# Patient Record
Sex: Male | Born: 1965 | Race: White | Hispanic: No | Marital: Single | Smoking: Never smoker
Health system: Southern US, Community
[De-identification: ages and names within clinical notes are randomized; demographics above are authoritative.]

## PROBLEM LIST (undated history)

## (undated) DIAGNOSIS — L309 Dermatitis, unspecified: Secondary | ICD-10-CM

## (undated) DIAGNOSIS — I10 Essential (primary) hypertension: Secondary | ICD-10-CM

## (undated) DIAGNOSIS — G4700 Insomnia, unspecified: Secondary | ICD-10-CM

## (undated) DIAGNOSIS — F329 Major depressive disorder, single episode, unspecified: Secondary | ICD-10-CM

## (undated) DIAGNOSIS — F32A Depression, unspecified: Secondary | ICD-10-CM

## (undated) DIAGNOSIS — K219 Gastro-esophageal reflux disease without esophagitis: Secondary | ICD-10-CM

## (undated) DIAGNOSIS — F419 Anxiety disorder, unspecified: Secondary | ICD-10-CM

## (undated) DIAGNOSIS — E785 Hyperlipidemia, unspecified: Secondary | ICD-10-CM

## (undated) DIAGNOSIS — T7840XA Allergy, unspecified, initial encounter: Secondary | ICD-10-CM

## (undated) HISTORY — DX: Insomnia, unspecified: G47.00

## (undated) HISTORY — DX: Allergy, unspecified, initial encounter: T78.40XA

## (undated) HISTORY — DX: Essential (primary) hypertension: I10

## (undated) HISTORY — DX: Gastro-esophageal reflux disease without esophagitis: K21.9

## (undated) HISTORY — DX: Major depressive disorder, single episode, unspecified: F32.9

## (undated) HISTORY — DX: Depression, unspecified: F32.A

## (undated) HISTORY — DX: Anxiety disorder, unspecified: F41.9

## (undated) HISTORY — DX: Hyperlipidemia, unspecified: E78.5

## (undated) HISTORY — DX: Dermatitis, unspecified: L30.9

## (undated) HISTORY — PX: HERNIA REPAIR: SHX51

---

## 2007-03-12 ENCOUNTER — Emergency Department (HOSPITAL_COMMUNITY): Admission: EM | Admit: 2007-03-12 | Discharge: 2007-03-12 | Payer: Self-pay | Admitting: Emergency Medicine

## 2007-03-22 ENCOUNTER — Emergency Department (HOSPITAL_COMMUNITY): Admission: EM | Admit: 2007-03-22 | Discharge: 2007-03-22 | Payer: Self-pay | Admitting: Family Medicine

## 2007-03-27 ENCOUNTER — Ambulatory Visit (HOSPITAL_COMMUNITY): Admission: RE | Admit: 2007-03-27 | Discharge: 2007-03-27 | Payer: Self-pay | Admitting: Orthopedic Surgery

## 2007-05-19 ENCOUNTER — Emergency Department (HOSPITAL_COMMUNITY): Admission: EM | Admit: 2007-05-19 | Discharge: 2007-05-19 | Payer: Self-pay | Admitting: Emergency Medicine

## 2007-06-06 ENCOUNTER — Emergency Department (HOSPITAL_COMMUNITY): Admission: EM | Admit: 2007-06-06 | Discharge: 2007-06-06 | Payer: Self-pay | Admitting: Family Medicine

## 2007-08-06 ENCOUNTER — Emergency Department (HOSPITAL_COMMUNITY): Admission: EM | Admit: 2007-08-06 | Discharge: 2007-08-06 | Payer: Self-pay | Admitting: Emergency Medicine

## 2007-08-20 ENCOUNTER — Emergency Department (HOSPITAL_COMMUNITY): Admission: EM | Admit: 2007-08-20 | Discharge: 2007-08-20 | Payer: Self-pay | Admitting: Emergency Medicine

## 2007-12-21 ENCOUNTER — Emergency Department (HOSPITAL_COMMUNITY): Admission: EM | Admit: 2007-12-21 | Discharge: 2007-12-21 | Payer: Self-pay | Admitting: Emergency Medicine

## 2008-01-12 ENCOUNTER — Emergency Department (HOSPITAL_COMMUNITY): Admission: EM | Admit: 2008-01-12 | Discharge: 2008-01-12 | Payer: Self-pay | Admitting: Emergency Medicine

## 2008-01-15 ENCOUNTER — Emergency Department (HOSPITAL_COMMUNITY): Admission: EM | Admit: 2008-01-15 | Discharge: 2008-01-15 | Payer: Self-pay | Admitting: Emergency Medicine

## 2008-02-16 ENCOUNTER — Emergency Department (HOSPITAL_COMMUNITY): Admission: EM | Admit: 2008-02-16 | Discharge: 2008-02-16 | Payer: Self-pay | Admitting: Family Medicine

## 2008-05-10 ENCOUNTER — Emergency Department (HOSPITAL_COMMUNITY): Admission: EM | Admit: 2008-05-10 | Discharge: 2008-05-10 | Payer: Self-pay | Admitting: Emergency Medicine

## 2008-05-12 ENCOUNTER — Emergency Department (HOSPITAL_COMMUNITY): Admission: EM | Admit: 2008-05-12 | Discharge: 2008-05-12 | Payer: Self-pay | Admitting: Emergency Medicine

## 2008-06-08 ENCOUNTER — Emergency Department (HOSPITAL_COMMUNITY): Admission: EM | Admit: 2008-06-08 | Discharge: 2008-06-08 | Payer: Self-pay | Admitting: Family Medicine

## 2008-09-23 ENCOUNTER — Emergency Department (HOSPITAL_COMMUNITY): Admission: EM | Admit: 2008-09-23 | Discharge: 2008-09-23 | Payer: Self-pay | Admitting: Emergency Medicine
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
## 2008-10-17 ENCOUNTER — Encounter: Admission: RE | Admit: 2008-10-17 | Discharge: 2008-10-17 | Payer: Self-pay | Admitting: Orthopaedic Surgery

## 2008-10-22 ENCOUNTER — Encounter: Admission: RE | Admit: 2008-10-22 | Discharge: 2008-10-22 | Payer: Self-pay | Admitting: Orthopaedic Surgery

## 2008-11-13 ENCOUNTER — Emergency Department (HOSPITAL_COMMUNITY): Admission: EM | Admit: 2008-11-13 | Discharge: 2008-11-14 | Payer: Self-pay | Admitting: Emergency Medicine

## 2008-11-15 ENCOUNTER — Emergency Department (HOSPITAL_COMMUNITY): Admission: EM | Admit: 2008-11-15 | Discharge: 2008-11-15 | Payer: Self-pay | Admitting: Emergency Medicine

## 2008-12-21 ENCOUNTER — Emergency Department (HOSPITAL_COMMUNITY): Admission: EM | Admit: 2008-12-21 | Discharge: 2008-12-21 | Payer: Self-pay | Admitting: Emergency Medicine

## 2009-03-24 ENCOUNTER — Emergency Department (HOSPITAL_COMMUNITY): Admission: EM | Admit: 2009-03-24 | Discharge: 2009-03-24 | Payer: Self-pay | Admitting: Emergency Medicine

## 2009-04-05 ENCOUNTER — Emergency Department (HOSPITAL_COMMUNITY): Admission: EM | Admit: 2009-04-05 | Discharge: 2009-04-05 | Payer: Self-pay | Admitting: Emergency Medicine

## 2009-04-14 ENCOUNTER — Encounter (INDEPENDENT_AMBULATORY_CARE_PROVIDER_SITE_OTHER): Payer: Self-pay | Admitting: Orthopedic Surgery

## 2009-04-14 ENCOUNTER — Ambulatory Visit: Payer: Self-pay | Admitting: Vascular Surgery

## 2009-04-14 ENCOUNTER — Ambulatory Visit: Admission: RE | Admit: 2009-04-14 | Discharge: 2009-04-14 | Payer: Self-pay | Admitting: Orthopedic Surgery

## 2009-05-28 ENCOUNTER — Emergency Department (HOSPITAL_COMMUNITY): Admission: EM | Admit: 2009-05-28 | Discharge: 2009-05-28 | Payer: Self-pay | Admitting: Emergency Medicine

## 2009-06-05 ENCOUNTER — Emergency Department (HOSPITAL_COMMUNITY): Admission: EM | Admit: 2009-06-05 | Discharge: 2009-06-05 | Payer: Self-pay | Admitting: Emergency Medicine

## 2009-06-08 ENCOUNTER — Emergency Department (HOSPITAL_COMMUNITY): Admission: EM | Admit: 2009-06-08 | Discharge: 2009-06-08 | Payer: Self-pay | Admitting: Chiropractic Medicine

## 2009-06-10 ENCOUNTER — Emergency Department (HOSPITAL_COMMUNITY): Admission: EM | Admit: 2009-06-10 | Discharge: 2009-06-10 | Payer: Self-pay | Admitting: Family Medicine

## 2009-07-18 ENCOUNTER — Encounter: Payer: Self-pay | Admitting: Family Medicine

## 2009-07-27 ENCOUNTER — Ambulatory Visit: Payer: Self-pay | Admitting: Family Medicine

## 2009-07-27 DIAGNOSIS — F341 Dysthymic disorder: Secondary | ICD-10-CM

## 2009-07-27 DIAGNOSIS — K219 Gastro-esophageal reflux disease without esophagitis: Secondary | ICD-10-CM

## 2009-08-01 ENCOUNTER — Telehealth: Payer: Self-pay | Admitting: Family Medicine

## 2009-08-02 ENCOUNTER — Telehealth: Payer: Self-pay | Admitting: Family Medicine

## 2009-08-02 ENCOUNTER — Ambulatory Visit: Payer: Self-pay | Admitting: Family Medicine

## 2009-08-03 ENCOUNTER — Telehealth: Payer: Self-pay | Admitting: Family Medicine

## 2009-08-04 ENCOUNTER — Telehealth (INDEPENDENT_AMBULATORY_CARE_PROVIDER_SITE_OTHER): Payer: Self-pay | Admitting: *Deleted

## 2009-08-08 ENCOUNTER — Telehealth: Payer: Self-pay | Admitting: Family Medicine

## 2009-08-09 ENCOUNTER — Encounter: Payer: Self-pay | Admitting: Family Medicine

## 2009-08-10 LAB — CONVERTED CEMR LAB
AST: 30 units/L (ref 0–37)
Albumin: 4.4 g/dL (ref 3.5–5.2)
BUN: 15 mg/dL (ref 6–23)
Calcium: 9.3 mg/dL (ref 8.4–10.5)
Chloride: 103 meq/L (ref 96–112)
HDL: 46 mg/dL (ref >39)
Potassium: 3.7 meq/L (ref 3.5–5.3)

## 2009-08-15 ENCOUNTER — Telehealth: Payer: Self-pay | Admitting: Family Medicine

## 2009-08-15 ENCOUNTER — Emergency Department (HOSPITAL_COMMUNITY): Admission: EM | Admit: 2009-08-15 | Discharge: 2009-08-16 | Payer: Self-pay | Admitting: Emergency Medicine

## 2009-08-23 ENCOUNTER — Ambulatory Visit: Payer: Self-pay | Admitting: Family Medicine

## 2009-08-23 DIAGNOSIS — R7309 Other abnormal glucose: Secondary | ICD-10-CM

## 2009-08-23 DIAGNOSIS — E785 Hyperlipidemia, unspecified: Secondary | ICD-10-CM | POA: Insufficient documentation

## 2009-08-24 ENCOUNTER — Telehealth: Payer: Self-pay | Admitting: Family Medicine

## 2009-08-26 ENCOUNTER — Telehealth: Payer: Self-pay | Admitting: Family Medicine

## 2009-08-29 ENCOUNTER — Ambulatory Visit: Payer: Self-pay | Admitting: Family Medicine

## 2009-08-29 ENCOUNTER — Telehealth: Payer: Self-pay | Admitting: Family Medicine

## 2009-08-29 ENCOUNTER — Encounter: Payer: Self-pay | Admitting: Family Medicine

## 2009-08-30 ENCOUNTER — Telehealth: Payer: Self-pay | Admitting: Family Medicine

## 2009-08-31 ENCOUNTER — Telehealth: Payer: Self-pay | Admitting: Family Medicine

## 2009-08-31 ENCOUNTER — Telehealth (INDEPENDENT_AMBULATORY_CARE_PROVIDER_SITE_OTHER): Payer: Self-pay | Admitting: *Deleted

## 2009-09-01 ENCOUNTER — Telehealth (INDEPENDENT_AMBULATORY_CARE_PROVIDER_SITE_OTHER): Payer: Self-pay | Admitting: *Deleted

## 2009-09-02 ENCOUNTER — Encounter (INDEPENDENT_AMBULATORY_CARE_PROVIDER_SITE_OTHER): Payer: Self-pay | Admitting: *Deleted

## 2009-09-02 ENCOUNTER — Encounter: Admission: RE | Admit: 2009-09-02 | Discharge: 2009-09-02 | Payer: Self-pay | Admitting: Family Medicine

## 2009-09-02 ENCOUNTER — Ambulatory Visit: Payer: Self-pay | Admitting: Family Medicine

## 2009-09-02 ENCOUNTER — Encounter: Payer: Self-pay | Admitting: Family Medicine

## 2009-09-05 ENCOUNTER — Ambulatory Visit: Payer: Self-pay | Admitting: Family Medicine

## 2009-09-07 ENCOUNTER — Telehealth: Payer: Self-pay | Admitting: Family Medicine

## 2009-09-09 ENCOUNTER — Telehealth: Payer: Self-pay | Admitting: Family Medicine

## 2009-09-12 ENCOUNTER — Encounter: Payer: Self-pay | Admitting: Family Medicine

## 2009-09-12 ENCOUNTER — Ambulatory Visit: Payer: Self-pay | Admitting: Family Medicine

## 2009-09-12 DIAGNOSIS — I10 Essential (primary) hypertension: Secondary | ICD-10-CM | POA: Insufficient documentation

## 2009-09-22 ENCOUNTER — Ambulatory Visit (HOSPITAL_BASED_OUTPATIENT_CLINIC_OR_DEPARTMENT_OTHER): Admission: RE | Admit: 2009-09-22 | Discharge: 2009-09-22 | Payer: Self-pay | Admitting: General Surgery

## 2009-09-23 ENCOUNTER — Telehealth: Payer: Self-pay | Admitting: Family Medicine

## 2009-09-28 ENCOUNTER — Encounter: Payer: Self-pay | Admitting: Family Medicine

## 2009-09-28 ENCOUNTER — Ambulatory Visit: Payer: Self-pay | Admitting: Family Medicine

## 2009-09-28 ENCOUNTER — Telehealth: Payer: Self-pay | Admitting: Family Medicine

## 2009-09-28 LAB — CONVERTED CEMR LAB
Amphetamine Screen, Ur: NEGATIVE
Benzodiazepines.: NEGATIVE
Marijuana Metabolite: NEGATIVE
Methadone: NEGATIVE
Phencyclidine (PCP): NEGATIVE

## 2009-09-29 ENCOUNTER — Telehealth: Payer: Self-pay | Admitting: Family Medicine

## 2009-09-29 ENCOUNTER — Encounter: Payer: Self-pay | Admitting: Family Medicine

## 2009-10-01 ENCOUNTER — Telehealth: Payer: Self-pay | Admitting: Family Medicine

## 2009-10-01 ENCOUNTER — Emergency Department (HOSPITAL_COMMUNITY): Admission: EM | Admit: 2009-10-01 | Discharge: 2009-10-01 | Payer: Self-pay | Admitting: Emergency Medicine

## 2009-10-03 ENCOUNTER — Telehealth: Payer: Self-pay | Admitting: Family Medicine

## 2009-10-03 ENCOUNTER — Ambulatory Visit: Payer: Self-pay | Admitting: Family Medicine

## 2009-10-04 ENCOUNTER — Telehealth: Payer: Self-pay | Admitting: Family Medicine

## 2009-10-06 ENCOUNTER — Telehealth: Payer: Self-pay | Admitting: Family Medicine

## 2009-10-07 ENCOUNTER — Emergency Department (HOSPITAL_COMMUNITY): Admission: EM | Admit: 2009-10-07 | Discharge: 2009-10-07 | Payer: Self-pay | Admitting: Emergency Medicine

## 2009-10-12 ENCOUNTER — Ambulatory Visit: Payer: Self-pay | Admitting: Family Medicine

## 2009-10-12 ENCOUNTER — Encounter: Payer: Self-pay | Admitting: Family Medicine

## 2009-10-13 ENCOUNTER — Telehealth: Payer: Self-pay | Admitting: Family Medicine

## 2009-10-19 ENCOUNTER — Ambulatory Visit: Payer: Self-pay | Admitting: Family Medicine

## 2009-10-25 ENCOUNTER — Emergency Department (HOSPITAL_COMMUNITY): Admission: EM | Admit: 2009-10-25 | Discharge: 2009-10-26 | Payer: Self-pay | Admitting: Emergency Medicine

## 2009-10-25 ENCOUNTER — Ambulatory Visit: Payer: Self-pay | Admitting: Family Medicine

## 2009-11-01 ENCOUNTER — Telehealth: Payer: Self-pay | Admitting: Family Medicine

## 2009-11-03 ENCOUNTER — Telehealth: Payer: Self-pay | Admitting: Family Medicine

## 2009-11-06 ENCOUNTER — Telehealth: Payer: Self-pay | Admitting: Family Medicine

## 2009-11-07 ENCOUNTER — Emergency Department (HOSPITAL_COMMUNITY): Admission: EM | Admit: 2009-11-07 | Discharge: 2009-11-08 | Payer: Self-pay | Admitting: Emergency Medicine

## 2009-11-07 ENCOUNTER — Telehealth: Payer: Self-pay | Admitting: Family Medicine

## 2009-11-07 ENCOUNTER — Encounter: Payer: Self-pay | Admitting: Family Medicine

## 2009-11-08 ENCOUNTER — Telehealth: Payer: Self-pay | Admitting: Family Medicine

## 2009-11-08 ENCOUNTER — Ambulatory Visit: Payer: Self-pay | Admitting: Family Medicine

## 2009-11-11 ENCOUNTER — Ambulatory Visit: Payer: Self-pay | Admitting: Family Medicine

## 2009-11-16 ENCOUNTER — Ambulatory Visit: Payer: Self-pay | Admitting: Family Medicine

## 2009-11-22 ENCOUNTER — Encounter: Payer: Self-pay | Admitting: Family Medicine

## 2009-11-29 ENCOUNTER — Telehealth: Payer: Self-pay | Admitting: Family Medicine

## 2009-12-05 ENCOUNTER — Ambulatory Visit: Payer: Self-pay | Admitting: Family Medicine

## 2009-12-22 ENCOUNTER — Ambulatory Visit: Payer: Self-pay | Admitting: Family Medicine

## 2009-12-28 ENCOUNTER — Telehealth: Payer: Self-pay | Admitting: Family Medicine

## 2009-12-28 ENCOUNTER — Emergency Department (HOSPITAL_COMMUNITY): Admission: EM | Admit: 2009-12-28 | Discharge: 2009-12-28 | Payer: Self-pay | Admitting: Emergency Medicine

## 2009-12-29 ENCOUNTER — Emergency Department (HOSPITAL_COMMUNITY): Admission: EM | Admit: 2009-12-29 | Discharge: 2009-12-29 | Payer: Self-pay | Admitting: Emergency Medicine

## 2009-12-29 ENCOUNTER — Ambulatory Visit: Payer: Self-pay | Admitting: Family Medicine

## 2009-12-31 ENCOUNTER — Emergency Department (HOSPITAL_COMMUNITY): Admission: EM | Admit: 2009-12-31 | Discharge: 2009-12-31 | Payer: Self-pay | Admitting: Family Medicine

## 2010-01-03 ENCOUNTER — Telehealth: Payer: Self-pay | Admitting: Family Medicine

## 2010-01-03 ENCOUNTER — Encounter: Payer: Self-pay | Admitting: Family Medicine

## 2010-01-06 ENCOUNTER — Ambulatory Visit: Payer: Self-pay | Admitting: Family Medicine

## 2010-01-06 ENCOUNTER — Encounter: Payer: Self-pay | Admitting: Family Medicine

## 2010-01-15 ENCOUNTER — Emergency Department (HOSPITAL_COMMUNITY): Admission: EM | Admit: 2010-01-15 | Discharge: 2010-01-15 | Payer: Self-pay | Admitting: Emergency Medicine

## 2010-01-16 ENCOUNTER — Telehealth: Payer: Self-pay | Admitting: Family Medicine

## 2010-01-18 ENCOUNTER — Telehealth: Payer: Self-pay | Admitting: Family Medicine

## 2010-01-19 ENCOUNTER — Encounter: Payer: Self-pay | Admitting: Family Medicine

## 2010-01-20 ENCOUNTER — Emergency Department (HOSPITAL_COMMUNITY): Admission: EM | Admit: 2010-01-20 | Discharge: 2010-01-20 | Payer: Self-pay | Admitting: Emergency Medicine

## 2010-02-07 ENCOUNTER — Telehealth: Payer: Self-pay | Admitting: Family Medicine

## 2010-02-07 ENCOUNTER — Ambulatory Visit: Payer: Self-pay | Admitting: Family Medicine

## 2010-02-27 ENCOUNTER — Ambulatory Visit: Payer: Self-pay | Admitting: Family Medicine

## 2010-02-28 ENCOUNTER — Encounter: Payer: Self-pay | Admitting: Family Medicine

## 2010-03-03 ENCOUNTER — Encounter: Payer: Self-pay | Admitting: Family Medicine

## 2010-03-09 ENCOUNTER — Encounter: Payer: Self-pay | Admitting: Family Medicine

## 2010-05-16 ENCOUNTER — Encounter: Payer: Self-pay | Admitting: Family Medicine

## 2010-05-25 ENCOUNTER — Telehealth: Payer: Self-pay | Admitting: Family Medicine
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
## 2010-06-06 ENCOUNTER — Ambulatory Visit: Payer: Self-pay | Admitting: Family Medicine

## 2010-06-06 ENCOUNTER — Encounter: Payer: Self-pay | Admitting: Family Medicine

## 2010-06-06 ENCOUNTER — Telehealth (INDEPENDENT_AMBULATORY_CARE_PROVIDER_SITE_OTHER): Payer: Self-pay | Admitting: Family Medicine

## 2010-06-06 DIAGNOSIS — M5380 Other specified dorsopathies, site unspecified: Secondary | ICD-10-CM | POA: Insufficient documentation

## 2010-06-07 ENCOUNTER — Telehealth: Payer: Self-pay | Admitting: Family Medicine

## 2010-06-07 ENCOUNTER — Encounter: Payer: Self-pay | Admitting: *Deleted

## 2010-06-07 LAB — CONVERTED CEMR LAB
CO2: 25 meq/L (ref 19–32)
Chloride: 105 meq/L (ref 96–112)
Glucose, Bld: 161 mg/dL — ABNORMAL HIGH (ref 70–99)
Potassium: 4.6 meq/L (ref 3.5–5.3)
Sodium: 140 meq/L (ref 135–145)

## 2010-07-10 ENCOUNTER — Telehealth: Payer: Self-pay | Admitting: Family Medicine

## 2010-07-13 ENCOUNTER — Ambulatory Visit: Payer: Self-pay | Admitting: Family Medicine

## 2010-07-13 DIAGNOSIS — G4700 Insomnia, unspecified: Secondary | ICD-10-CM

## 2010-07-18 ENCOUNTER — Emergency Department (HOSPITAL_COMMUNITY)
Admission: EM | Admit: 2010-07-18 | Discharge: 2010-07-19 | Payer: Self-pay | Source: Home / Self Care | Admitting: Emergency Medicine

## 2010-07-21 ENCOUNTER — Ambulatory Visit: Payer: Self-pay | Admitting: Family Medicine

## 2010-08-09 ENCOUNTER — Telehealth: Payer: Self-pay | Admitting: Family Medicine

## 2010-08-27 ENCOUNTER — Encounter: Payer: Self-pay | Admitting: Orthopaedic Surgery

## 2010-08-28 ENCOUNTER — Encounter: Payer: Self-pay | Admitting: Orthopedic Surgery

## 2010-09-05 NOTE — Progress Notes (Signed)
  Phone Note Call from Patient   Caller: Patient Summary of Call: Pt called due to tooth pain.  Pt has a appointment on tuesday for one tooth to be pulled and one needed a root canal.  Pt states he is having significant pain in the lower jaw around the tooth.  Pt was on percocet earlier on and was helping but no on vicodin and is not helping at all.  Pt states the pain is as bad as it first was initially.  Pt denies fever, chills, nausea, vomiting, diarrhea or constipation .  Pt told that we could not fill rx over the phone for narcotics.  To continue NSAIDs to help decrease inflammation.  If pain is unbearable or he gets a fever, he needs to be seen by urgent care or ED, otherwise attempt to wait till monday and we can get him a workin appointment.

## 2010-09-05 NOTE — Assessment & Plan Note (Signed)
Summary: tooth pain-see notes/Eros/Timothy Lowe   Vital Signs:  Patient profile:   45 year old male Weight:      197.7 pounds Temp:     98 degrees F oral Pulse rate:   86 / minute BP sitting:   134 / 92  (left arm)  Vitals Entered By: Renato Battles slade,cma CC: see note. pt has been at the ER on Saturday. received 2 Percocet Is Patient Diabetic? No Pain Assessment Patient in pain? yes     Location: bottom left tooth Intensity: 9 Onset of pain  saturday   Primary Care Provider:  Clementeen Graham  CC:  see note. pt has been at the ER on Saturday. received 2 Percocet.  History of Present Illness: Here with severe tooth pain, was given script for hydrocodone per primary MD and pain contract was made.  He has visited the dentist through the indigent program and he has 2 appointments one on 3/9, and one on 3/15.  He has so much pain this weekend that he visited the ER, and was given 2 Percocet that helped his pain.  He says that the hydrocodone/APAP 2 tabs with the Voltarin does not touch the pain. He has been using orajel, and tried dental wax that fall out.  He is interviewing for a job, is not sleeping and is asking for a script for Percocet until his teeth are repaired.  His primary MD is not in clinic, and he was brought in as a work in.  It was agreed for only one scipt until the teeth are fixed, that he would be provided with #30 Percocet 5/325.  He will switch to ibuprofen 800 mg three times a day since the diclofenec is not effective.  He has a follow up apt with Dr. Denyse Amass on 3/15.  Habits & Providers  Alcohol-Tobacco-Diet     Tobacco Status: never  Allergies: No Known Drug Allergies  Physical Exam  General:  Alert, well groomed, appropriate Mouth:  2 broken teeth, bottom first molar with crack in tooth and very sensitive to touch.   Impression & Recommendations:  Problem # 1:  FRACTURED TOOTH (VWU-981.19)  Unfortunately patient was brought in for a legitimate problem requiring a  change in pain med that was not in the written pain contract and MD was not in clinic.  Felt that his pain was justified and he agreed to not take the hydrocodone that he had remaining, he will and add ibuprofen 800 mg three times a day on a regular schedule, may use Percocet at night to get sleep, not more than 2 per day.  Patient seems to be forthright and dependable.  I explained to him that this was not consistent with the pain contract he has, and it could cause problems if this pattern would continue.  Given script for Percocet 5/325 #30, no refills.  This is to last through dental appointments on 9/9 and 9/15.  Orders: [facility]- Est Level  3 (14782)  Complete Medication List: 1)  Lexapro 20 Mg Tabs (Escitalopram oxalate) .Marland Kitchen.. 1 by mouth daily 2)  Wellbutrin Xl 150 Mg Xr24h-tab (Bupropion hcl) .Marland Kitchen.. 1 by mouth daily 3)  Ambien 10 Mg Tabs (Zolpidem tartrate) .Marland Kitchen.. 1 by mouth qhs 4)  Penicillin V Potassium 500 Mg Tabs (Penicillin v potassium) .... Take 1 tab by mouth three times a day 5)  Pravastatin Sodium 40 Mg Tabs (Pravastatin sodium) .... Take 1 pill by mouth daily 6)  Ventolin Hfa 108 (90 Base) Mcg/act Aers (Albuterol sulfate) .Marland KitchenMarland KitchenMarland Kitchen  2 puffs every 4 hours as needed for sob; disp 1 inhaler 7)  Prilosec 20 Mg Cpdr (Omeprazole) .Marland Kitchen.. 1 by mouth daily 8)  Hydrochlorothiazide 25 Mg Tabs (Hydrochlorothiazide) .... Take 1 tab by mouth daily 9)  Diclofenac Sodium 75 Mg Tbec (Diclofenac sodium) .Marland Kitchen.. 1 by mouth bid 10)  Percocet 5-325 Mg Tabs (Oxycodone-acetaminophen) .... One tab two times a day until dental visit  Patient Instructions: 1)  Ibuprofen 800 mg three times a day all the time( 4 ot the 200 mg) 2)  Do not take more than 2400 mg of ibuprofen 3)   in 24 hours. 4)  Tylenol doseing is not more than 4000 mg in 24 hours. 5)  Percocet is for 2 weeks, until toot is repaired. Prescriptions: PERCOCET 5-325 MG TABS (OXYCODONE-ACETAMINOPHEN) one tab two times a day until Dental visit Brand medically  necessary #30 x 0   Entered and Authorized by:   Luretha Murphy NP   Signed by:   Luretha Murphy NP on 10/03/2009   Method used:   Print then Give to Patient   RxID:   [id number]

## 2010-09-05 NOTE — Progress Notes (Signed)
Summary: phn msg  Phone Note Call from Patient Call back at Home Phone [phone]   Caller: Patient Summary of Call: wants to know about the referral for his hernia Initial call taken by: De Nurse,  August 31, 2009 10:17 AM  Follow-up for Phone Call        spoke with patient and advised that referral has been sent to [facility] and we are waiting to hear from them if they have a surgeon available to schedule appointment with. patient understands process. Follow-up by: Theresia Lo RN,  August 31, 2009 10:24 AM
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
## 2010-09-05 NOTE — Progress Notes (Signed)
Summary: Tooth pain   Phone Note Call from Patient   Summary of Call: Pain management for dental work by Dr. Denyse Amass - on wait list to get root canal - broke tooth that needed root canal two days ago. Calling because he is almost out of pain medication; wants a refill as his tooth is hurting.   Denies fever, difficulty swallowing or breathing, emesis, lethargy. Advised to call triage line in AM to get an appointment to be seen, or to call tro try to see if Dr. Denyse Amass would be willing to refill his pain medication.  Initial call taken by: Bobby Rumpf  MD,  November 06, 2009 1:18 PM     Appended Document: Tooth pain  lm with a woman who answered. asked that she have him call his doctor's office back

## 2010-09-05 NOTE — Progress Notes (Signed)
Summary: pls call  Phone Note Call from Patient Call back at Home Phone [phone]   Caller: Patient Summary of Call: needs for Dr Denyse Amass to call and ask question - has an interview tomorrow at 2:45 and will be leaving at 2:30 -  Initial call taken by: De Nurse,  September 07, 2009 2:26 PM  Follow-up for Phone Call        Called.  He will schedule an appointment with me next week.

## 2010-09-05 NOTE — Progress Notes (Signed)
Summary: Referral Req  Phone Note Call from Patient Call back at Home Phone [phone]   Caller: Patient Summary of Call: Pt is requesting a referral to an orthopedist about his ankles. Initial call taken by: Clydell Hakim,  January 18, 2010 3:07 PM  Follow-up for Phone Call        I have not seen Mr Q about this complaint. Therefore I do not know what his specific ankle complaints are.  If he would like to come for an office visit myself or another physician would be happy to evlauate him and referr if needed.  Follow-up by: Clementeen Graham MD,  January 19, 2010 1:55 PM

## 2010-09-05 NOTE — Progress Notes (Signed)
Summary: Rx Req  Phone Note Refill Request Call back at Home Phone [phone] Message from:  Patient  Refills Requested: Medication #1:  OXYCODONE-ACETAMINOPHEN 10-325 MG TABS 1 by mouth q4-6 hrs as needed severe pain.. Pt has appt with [facility] at end of month.    Initial call taken by: Clydell Hakim,  August 15, 2009 10:01 AM  Follow-up for Phone Call        will forward to MD. Follow-up by: Theresia Lo RN,  August 15, 2009 11:57 AM  Additional Follow-up for Phone Call Additional follow up Details #1::        Pt has a dental appt on Jan 26th.   Called patient.  He will pick up Rx at [facility] for 50 10/325mg  percocet tabs.  This should last two weeks. [facility] at front desk. Please date RX.  Will f/u with me Jan 18th.      Appended Document: Rx Req patient came in office and RX given to him.

## 2010-09-05 NOTE — Progress Notes (Signed)
Summary: triage  Phone Note Call from Patient Call back at [phone]   Caller: Patient Summary of Call: Pt wanting a referral to an orthopedist for his ankle and also wanting to get rx for anxiety due to family stressors. [facility] [address] Initial call taken by: De Nurse,  Jan 03, 2010 9:31 AM  Follow-up for Phone Call       Follow-up by: Golden Circle RN,  Jan 03, 2010 8:49 AM  Additional Follow-up for Phone Call Additional follow up Details #1::        Called patient and talked about ankle pain.  He "twisted" his ankle several times.  He also talked about anxiet medication.  He will make an appointment this Friday with me. OK to double book. Additional Follow-up by: Clementeen Graham MD,  Jan 03, 2010 11:02 AM
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
## 2010-09-05 NOTE — Miscellaneous (Signed)
Summary: [facility] Controlled Substances Contract  [facility] Controlled Substances Contract   Imported By: Clydell Hakim 09/15/2009 15:10:19  _____________________________________________________________________  External Attachment:    Type:   Image     Comment:   External Document
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
## 2010-09-05 NOTE — Progress Notes (Signed)
Summary: SEE DR.TA'S MESSAGE PLEASE/TS  Phone Note Refill Request Call back at Home Phone [phone] Call back at [phone]   Refills Requested: Medication #1:  AMBIEN 10 MG TABS 1 by mouth qhs Timothy Lowe need refill for his Ambien to [facility] on [address]  Initial call taken by: Abundio Miu,  June 06, 2010 10:33 AM  Follow-up for Phone Call        Mr. Timothy Lowe want to talk with you about the denial for refill on his Ambien.  He said that was why he came in to see you. Follow-up by: Abundio Miu,  June 06, 2010 12:26 PM  Additional Follow-up for Phone Call Additional follow up Details #1::        pt states that he thought she was refilling meds since she said she was going to call in the 2 that needed refilling (thinking that the Ambien was one of them)  He states the it is his fault that he did not specifically speak about the Ambien.  He got them in Jan with 8 refills and he has been trying since Sept to get this refilled and has tried to go through the steps that we require and still can't seem to get the refill.  not sure what he should do at this point. Additional Follow-up by: De Nurse,  June 06, 2010 1:48 PM    Additional Follow-up for Phone Call Additional follow up Details #2::    According to our chart Ambien was given one time in Aug 30, 2009 for #30 tabs, zero refills.  If he has been getting them since Jan with 8 refills, he has not gotten them from this office.  Again, we did not discuss ambien during office visit.  I will not refill this med. Follow-up by: Cat Ta MD,  June 06, 2010 3:30 PM  No.  I will not refill this.  He has not had this since Jan.  We did not discuss this in the OV.  Cat Ta MD  June 06, 2010 10:50 AM

## 2010-09-05 NOTE — Miscellaneous (Signed)
  Clinical Lists Changes  Mr Chock called requesting Rx for Ambien, says that was the only reason for his visit with Dr Janalyn Harder. After speaking with Dr Janalyn Harder I Explained to pt that he and Dr Janalyn Harder did NOT disscuss this during the visit and she would not prescribe the ambien at this time, there would need to be an office visit to disscuss this particular med before it would be prescribed to him. Billal continued to argue that was the only reason that he came in, was for the [place] and wanted to know why he couldn't have it. States that this was prescribed to him before by Dr Denyse Amass with 8 refills. After looking in the chart it seems that this Rx was prescribed once with no refills. I explained to him several times that Dr Janalyn Harder was now his MD and would NOT be prescribing the ambien at this time and Mr Barradas stated " what do I have to do to get my GD [place]." He also said that he cannot deal with Dr Janalyn Harder anymore and no longer wants her as his MD. Afton continued to argue with me and be rude, not understanding why he could not get the [place] and finally hung up the phone.Tessie Fass CMA  June 07, 2010 5:27 PM

## 2010-09-05 NOTE — Progress Notes (Signed)
Summary: Appt  Phone Note Call from Patient Call back at Home Phone [phone]   Reason for Call: Talk to Nurse Summary of Call: pt was told to f/u with [facility] on Friday but she is booked except for her [place] appts, please advise, pt already has appt scheduled with Denyse Amass on 4/13. Initial call taken by: Knox Royalty,  November 08, 2009 11:09 AM  Follow-up for Phone Call        to Dr. Denyse Amass Follow-up by: Golden Circle RN,  November 08, 2009 12:18 PM

## 2010-09-05 NOTE — Progress Notes (Signed)
Summary: Rx Prob  Phone Note Call from Patient Call back at Home Phone [phone]   Caller: Patient Summary of Call: Pt asking to speak with Dr. Denyse Amass about his pain med management for his teeth that he is having worked on.  Really would like to talk to him today said he left a message last week. Initial call taken by: Clydell Hakim,  November 01, 2009 11:39 AM  Follow-up for Phone Call        states he has waited more than a week & md has not called him back.  told him I do not see where a message was left for the md. he will try a few more weeks, then may choose to change mds. explained schedules of our residents.  he is using tylenol in between. went to ED & they gave him enough percocets for the next 24 hours.  having one pulled tomorrow. the root canal tooth will be in a few weeks.  his main issue with coming to [place] is pain management for the teeth problms he has been having. he uses the meds as ordered but when he has spikes of pain he uses more or goes to ED. not what he wants to do. told him I will page the md & call him with his response. Follow-up by: Golden Circle RN,  November 01, 2009 11:41 AM  Additional Follow-up for Phone Call Additional follow up Details #1::        md returned page. I asked him to call the pt. LM for pt that he should be getting a return call from pcp Additional Follow-up by: Golden Circle RN,  November 01, 2009 11:51 AM    Additional Follow-up for Phone Call Additional follow up Details #2::    Called back. Had to go to the ED due to uncontrolled mouth pain.  Vicodin only works for 1.5 hours.  He thinks the percocet that he was given in the ED works better. Has appt for tooth pull on 3/30 and is 3rd in line for a root cannal.  Should be done in 30 days.  Plan. Will leave Rx for 45 tabs at the nurses's desk.  He will pick it up tomorrow.  Follow-up by: Clementeen Graham MD,  November 01, 2009 12:17 PM

## 2010-09-05 NOTE — Progress Notes (Signed)
Summary: Ref Req  Phone Note Call from Patient Call back at Home Phone [phone]   Caller: Patient Summary of Call: Pt would like to be referred to someone about his hernia. Initial call taken by: Clydell Hakim,  August 26, 2009 4:05 PM  Follow-up for Phone Call        will forward to MD. Follow-up by: Theresia Lo RN,  August 26, 2009 4:08 PM  Additional Follow-up for Phone Call Additional follow up Details #1::        Order in and letter sent.   Will f/u with patient at next visit.
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
## 2010-09-05 NOTE — Progress Notes (Signed)
Summary: pls call  Phone Note Call from Patient Call back at Home Phone [phone]   Caller: Patient Summary of Call: needs to talk to Dr Denyse Amass about meds Initial call taken by: De Nurse,  September 29, 2009 8:38 AM  Follow-up for Phone Call        Called patient.  He had questions about his pain medication.  We will not provide more narcotics until March 15th at the earliest.  He understands this.

## 2010-09-05 NOTE — Progress Notes (Signed)
Summary: meds prob  Phone Note Call from Patient Call back at Home Phone [phone]   Caller: Patient Summary of Call: has cough meds and has run out - has been taking it every 12 hrs and ran out early - pharm told him that we just needed to call to authorize it being refilled early. [facility] [address] Initial call taken by: De Nurse,  September 09, 2009 10:21 AM  Follow-up for Phone Call        to preceptor since pcp is not here. see previous notes about his meds Follow-up by: Golden Circle RN,  September 09, 2009 10:57 AM  Additional Follow-up for Phone Call Additional follow up Details #1::        Refill denied.  Suggest OTC robitussin DM Additional Follow-up by: Doralee Albino MD,  September 09, 2009 11:04 AM    Additional Follow-up for Phone Call Additional follow up Details #2::    lm Follow-up by: Golden Circle RN,  September 09, 2009 1:52 PM  Additional Follow-up for Phone Call Additional follow up Details #3:: Details for Additional Follow-up Action Taken: he states he talked with Dr. Georgiana Shore & she told him her could take it every 12 hours. I told him her Ov notes indicate HS only. told him no early refill. states he will be better by then. states he feels much better already. told him to use the recommended OTC if he feels he needs it. states he has appt with Dr. Denyse Amass on Monday  Additional Follow-up by: Golden Circle RN,  September 09, 2009 2:10 PM

## 2010-09-05 NOTE — Progress Notes (Signed)
Summary: phn msg  Phone Note Call from Patient Call back at Home Phone [phone]   Caller: Patient Summary of Call: pt needs to talk to Topanga Alvelo about meds  went to ED Sat about his tooth -  Initial call taken by: De Nurse,  October 03, 2009 9:04 AM  Follow-up for Phone Call        ate fish & a bone got into the tooth that he is waiting for a root canal. pain was so bad, he went to ED. They gace him 2 percocets 5mg  when in ED.  He was worried about breaking the pain contract. told him it was fine as he reported it asap. current meds are not enough.  has appt 9th with dentist. does not feel he can wait. he reported this to the dental assistant & she just told him "we will see you on the 9th" workin at 11am today  Follow-up by: Golden Circle RN,  October 03, 2009 9:21 AM
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
## 2010-09-05 NOTE — Miscellaneous (Signed)
Summary: almost out of meds  Clinical Lists Changes states she will be out of meds by the end of the week. he has a job interview in [employer] next week & does not want to go there with intense tooth pain. would prefer a refill instead of having to come back in. he is #6 on the list for a root canal. told him I will ask md & let him know what he says.Golden Circle RN  November 07, 2009 11:47 AM

## 2010-09-05 NOTE — Progress Notes (Signed)
Summary: dental referral  Phone Note Call from Patient Call back at Home Phone [phone]   Caller: Patient Summary of Call: pls resend referral to Dental Clinic - per patient - he didn't understand the process yesterday To - Sherryl Barters  Initial call taken by: De Nurse,  August 24, 2009 8:45 AM  Follow-up for Phone Call        Urgent Referral for Adult [facility] faxed to [phone].  Follow-up by: Terese Door,  August 24, 2009 2:22 PM

## 2010-09-05 NOTE — Progress Notes (Signed)
Summary: phn msg  Phone Note Call from Patient Call back at [facility] Phone [phone]   Caller: Patient Summary of Call: Pt just had tooth extracted yesterday and still on waiting list for root canal. Initial call taken by: Clydell Hakim,  November 03, 2009 11:06 AM

## 2010-09-05 NOTE — Progress Notes (Signed)
Summary: FYI  Phone Note Call from Patient   Caller: Patient Summary of Call: was told by pt that he has never had any luck getting in to see [facility] for his tooth extractions.  he stated that he thinks that people must camp out because when he gets there the closest he's been is # 53.  he is now trying to get in to the Adult dental clinic and knows it will be a very long wait.  I called [facility] and was told they do not have long lines and that ususally can get in same day.  This week is a little different since they were on vacation last week, but most of the time, they can see a patient very readily.  Pt does not know I called. Initial call taken by: De Nurse,  August 29, 2009 4:38 PM  Follow-up for Phone Call        told him rx for Remus Loffler is here for him to pick up.   he is taking the narcotic pain med 4 times a day & will be out Thursday. would like refill. states he has been in pain 6 months.   states he is a college grad & has always had health coverage & dental. praised [facility] for our professionalism & kindness & willing to help.  Follow-up by: Golden Circle RN,  August 30, 2009 10:38 AM
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
## 2010-09-05 NOTE — Miscellaneous (Signed)
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
## 2010-09-05 NOTE — Progress Notes (Signed)
Summary: pain meds  Phone Note Call from Patient Call back at Home Phone [phone]   Caller: Patient Summary of Call: needs to pick up remaining rx for his pain meds. Initial call taken by: De Nurse,  August 08, 2009 9:53 AM  Follow-up for Phone Call        spoke with patient and advised that Dr. Sandi Mealy will write tomorrow as discussed last Thursday . he understands this and states that was nthe purpose of his call today so that RX can be ready for  him tomorrow when  he comes in for labs. will send  message to MD. Follow-up by: Theresia Lo RN,  August 08, 2009 10:04 AM  Additional Follow-up for Phone Call Additional follow up Details #1::        script written and placed at front desk for percocet #50.      Prescriptions: OXYCODONE-ACETAMINOPHEN 10-325 MG TABS (OXYCODONE-ACETAMINOPHEN) 1 by mouth q4-6 hrs as needed severe pain.  #50 x 0   Entered and Authorized by:   Ancil Boozer  MD   Signed by:   Ancil Boozer  MD on 08/09/2009   Method used:   Handwritten   RxID:   [id number]

## 2010-09-05 NOTE — Assessment & Plan Note (Signed)
Summary: high bp/corey/eo   Vital Signs:  Patient profile:   45 year old male Height:      72 inches Weight:      236 pounds Temp:     98.1 degrees F oral Pulse rate:   74 / minute BP sitting:   139 / 108  Vitals Entered By: Angeline Slim MD (October 12, 2009 4:21 PM)  Serial Vital Signs/Assessments:  Time      Position  BP       Pulse  Resp  Temp     By                     141/103                        Sharlena Kristensen MD   Primary Care Provider:  Clementeen Graham  CC:  elevated bp.  History of Present Illness: Dental complaint:  Was seen at Dentist office today for extraction of tooth #13.  BP was elevated, 158/115, 166/119, 160, 120.  They refused to do extraction.  Pt was seen by me as work in for elevated BP.  When he got here he only wanted to discuss pain med.  Told me the Percocet he was prescribed made him feel strange and he "flushed" them.  States that Dr Denyse Amass has been prescribing him Vicodin since Dec 2010 and that he has appt with Dr Denyse Amass next week.  States that Dr Denyse Amass prescribed Vicodin for him on 09/28/09 but that he is out now.  I told him that I was concerned about his BP and want to discuss that.  Taking PCN daily since 07/2009 for dental pain.    HYPERTENSION Disease Monitoring: does not check Blood pressure range: 140s/100s Chest pain: no Dyspnea: no Lightheadedness:no Edema: no  Vision change: no Medications HCTZ 25mg  Compliance: yes  Prevention Exercise:  not exercising  Salt restriction: no restriction  Pain Meds: ibuprofen 800mg  by mouth qid, tylenol 1000mg  every 4 hrs.  without relief.  States that ultram did not work. out of vicodin.    S/p Umbilical hernia repair 09/2009 by Dr Zachery Dakins.    Allergies: No Known Drug Allergies  Review of Systems       per hpi  Physical Exam  General:  Well-developed,well-nourished,in no acute distress; alert,appropriate and cooperative throughout examination. vitals reviewed.  Eyes:  No corneal or conjunctival inflammation noted.  EOMI. Perrla. Funduscopic exam benign, without hemorrhages, exudates or papilledema. Vision grossly normal. Mouth:  Poor dentition.  Gums not inflamed, red, or swollen.  no gingival abnormalities and pharynx pink and moist.   Lungs:  Normal respiratory effort, chest expands symmetrically. Lungs are clear to auscultation, no crackles or wheezes. Heart:  Normal rate and regular rhythm. S1 and S2 normal without gallop, murmur, click, rub or other extra sounds. Cervical Nodes:  No lymphadenopathy noted   Impression & Recommendations:  Problem # 1:  HYPERTENSION, BENIGN ESSENTIAL (ICD-401.1) Assessment Deteriorated  Told pt that I am seeing him today for elevated BP.  Will add Lisinopril since DBP is >90.  I have told pt to stop taking HCTZ when he picks up new script for combo hctz-lisinopril.  since pt has appt with Dr Denyse Amass next week, I did not ask pt to schedule f/u for BP med change.  He will need Cr checked. The following medications were removed from the medication list:    Hydrochlorothiazide 25 Mg Tabs (Hydrochlorothiazide) .Marland Kitchen... Take  1 tab by mouth daily His updated medication list for this problem includes:    Zestoretic 20-25 Mg Tabs (Lisinopril-hydrochlorothiazide) .Marland Kitchen... 1 tab by mouth daily for blood pressure  Orders: [facility]- Est Level  3 (44010)  Problem # 2:  DENTAL PAIN (ICD-525.9) Assessment: Unchanged  Pt refused offer for ultram.  I told him to take ibuprofen around the clock.  he states that it does not work.  I believe that pt came to clinic today because he did not get vicodin at dentist office.  Pt has appt scheduled with Dr Denyse Amass next week for this.    Orders: [facility]- Est Level  3 (27253)  Complete Medication List: 1)  Lexapro 20 Mg Tabs (Escitalopram oxalate) .Marland Kitchen.. 1 by mouth daily 2)  Wellbutrin Xl 150 Mg Xr24h-tab (Bupropion hcl) .Marland Kitchen.. 1 by mouth daily 3)  Ambien 10 Mg Tabs (Zolpidem tartrate) .Marland Kitchen.. 1 by mouth qhs 4)  Penicillin V Potassium 500 Mg Tabs (Penicillin v  potassium) .... Take 1 tab by mouth three times a day 5)  Pravastatin Sodium 40 Mg Tabs (Pravastatin sodium) .... Take 1 pill by mouth daily 6)  Ventolin Hfa 108 (90 Base) Mcg/act Aers (Albuterol sulfate) .... 2 puffs every 4 hours as needed for sob; disp 1 inhaler 7)  Prilosec 20 Mg Cpdr (Omeprazole) .Marland Kitchen.. 1 by mouth daily 8)  Diclofenac Sodium 75 Mg Tbec (Diclofenac sodium) .Marland Kitchen.. 1 by mouth bid 9)  Percocet 5-325 Mg Tabs (Oxycodone-acetaminophen) .... One tab two times a day until dental visit 10)  Zestoretic 20-25 Mg Tabs (Lisinopril-hydrochlorothiazide) .Marland Kitchen.. 1 tab by mouth daily for blood pressure  Patient Instructions: 1)  Please schedule an appointment with your primary doctor.  2)  New medication for blood pressure is combo drug HCTZ-Lisinopril.  Stop taking HCTZ when you pick up your new medication.  3)  You can take Ibuprofen 800mg  every 6 hrs for dental pain.  Prescriptions: ZESTORETIC 20-25 MG TABS (LISINOPRIL-HYDROCHLOROTHIAZIDE) 1 tab by mouth daily for blood pressure  #30 x 6   Entered and Authorized by:   Angeline Slim MD   Signed by:   Angeline Slim MD on 10/12/2009   Method used:   Faxed to ...       [facility] Department (retail)       [address] [place], [place]  [address]       Ph: [phone]       Fax: [phone]   RxID:   [id number]

## 2010-09-05 NOTE — Progress Notes (Signed)
Summary: TRIAGE  Phone Note Call from Patient Call back at [facility] Phone [phone]   Caller: Patient Summary of Call: COUGH IS NOT [facility] BETTER AND WANTS TO TALK TO NURSE Initial call taken by: De Nurse,  August 31, 2009 2:45 PM  Follow-up for Phone Call        states he has a productive cough.  has vomited from so much coughing. told him he is on meds that should help & that it will take time. to sips on fluids all day & suck on hard candy or cough drops. can try teaspoon of honey every 4 hrs. he states he has Mucinex dm & he is going to take that. does not smoke. has a slight HA. denies fever, body aches. call back if not better by friday am for same day appt. he agreed with plan Follow-up by: Golden Circle RN,  August 31, 2009 2:58 PM

## 2010-09-05 NOTE — Progress Notes (Signed)
Summary: phn msg  Phone Note Call from Patient Call back at Home Phone [phone]   Caller: Patient Summary of Call: needs to talk to nurse about meds Initial call taken by: De Nurse,  August 30, 2009 1:58 PM  Follow-up for Phone Call        will be out of pain meds on Thurseday and would like refills on Oxycodone Follow-up by: Gladstone Pih,  August 30, 2009 2:36 PM  Additional Follow-up for Phone Call Additional follow up Details #1::        Per office note of 1/18, he is using pain med for tooth pain and he has a dental appointment for 1/26 (tomorrow.)  He should make sure he keeps this appointment and discuss pain meds with dentist.  Dr. Zollie Pee note indicates he only planned to provide pain meds until the dental appointment. Additional Follow-up by: Doralee Albino MD,  August 30, 2009 2:47 PM    Additional Follow-up for Phone Call Additional follow up Details #2::    pt stated he is no longer going to try the dental clinic at [facility] [address]. He is on urgent list with [facility]. He does not know when he will be called Follow-up by: Gladstone Pih,  August 30, 2009 3:40 PM  Additional Follow-up for Phone Call Additional follow up Details #3:: Details for Additional Follow-up Action Taken: Given that he did not follow through, I am not willing to supply high dose narcotics indefinately.  I will provide one more perscription of a lower potency narcotic (hydrocodone/APAP).  He will not receive further narcotic refills from this office for this dental pain.   Additional Follow-up by: Doralee Albino MD,  August 30, 2009 3:51 PM  New/Updated Medications: HYDROCODONE-ACETAMINOPHEN 5-500 MG TABS (HYDROCODONE-ACETAMINOPHEN) one by mouth q6h as needed pain Prescriptions: HYDROCODONE-ACETAMINOPHEN 5-500 MG TABS (HYDROCODONE-ACETAMINOPHEN) one by mouth q6h as needed pain  #100 x 0   Entered and Authorized by:   Doralee Albino MD   Signed by:   Doralee Albino MD on  08/30/2009   Method used:   Handwritten   RxID:   [id number]  left message that rx at front desk. will explain no more & change of drugs when he comes in for it.Gladstone Pih  August 30, 2009 4:08 PM

## 2010-09-05 NOTE — Miscellaneous (Signed)
Summary: Chart Review  Clinical Lists Changes  Discussed with Dr Janalyn Harder and Dr Jennette Kettle.  Mr Bart had one provider switch so per policy we can not change providers again. We will no longer prescribe any controlled substances. Discussing with Dr Janalyn Harder if Mr Winders makes any threatening actions or remarks or acts in an uncivil manner we will strongly consider dismissal Pearlean Brownie MD  March 03, 2010 2:51 PM

## 2010-09-05 NOTE — Assessment & Plan Note (Signed)
Summary: chest cold no better,tcb   Vital Signs:  Patient profile:   45 year old male Weight:      248 pounds O2 Sat:      95 % on Room air Temp:     97.4 degrees F oral Pulse rate:   92 / minute BP sitting:   146 / 93  (left arm) Cuff size:   large  Vitals Entered By: Arlyss Repress CMA, (September 05, 2009 10:36 AM)  O2 Flow:  Room air CC: f/up CXR. cough is not better. unable to sleep due to cough. Is Patient Diabetic? No Pain Assessment Patient in pain? yes     Location: chest Intensity: 9 Onset of pain  with coughing.   Primary Care Provider:  Clementeen Graham  CC:  f/up CXR. cough is not better. unable to sleep due to cough..  History of Present Illness: Timothy Lowe comes in today for persistent cough.  He was first seen 1/24 for URI and again on 1/28 for no resolution.  Please see previous notes for details.  On Friday a CXR was obtained, which was negative for pneumonia but did show increased interstitial markings.  He was given an albuterol inhaler, azithromycin, and hycodan cough medicine.  He has not improved at all.  Nothing seems to be helping his cough.  Keeping him away.  Dry cough.  No fever.  Still having some runny nose, but otherwise only symptom is cough.  Experiencing post-tussive emesis and post-tussive lightheadedness.  Never smoked.   Habits & Providers  Alcohol-Tobacco-Diet     Tobacco Status: never  Allergies: No Known Drug Allergies  Physical Exam  General:  VS noted reviewed Well appearing in NAD but coughing frequently Eyes:  conjunctiva clear and moist, no injection Lungs:  normal WOB.  Deep insp causes cough.  Exp wheezing present.  No crackles.  Equal and good air movement Heart:  RRR without murmur   Impression & Recommendations:  Problem # 1:  ACUTE BRONCHITIS (ICD-466.0) Assessment New  Appears to have acute bronchitis.  Can finish azithro.  Continue albuterol if needed and hycodan.  Will give 7 day burst of prednisone and add nasal  steroid.  Discussed cough can last up to 3-4 weeks but that hopefully the prednisone will help calm things down.  Also refilled hycodan for patient.  His updated medication list for this problem includes:    Penicillin V Potassium 500 Mg Tabs (Penicillin v potassium) .Marland Kitchen... Take 1 tab by mouth three times a day    Azithromycin 500 Mg Tabs (Azithromycin) ..... One by mouth daily for 7 days    Hydrocodone-homatropine 5-1.5 Mg/91ml Syrp (Hydrocodone-homatropine) .Marland Kitchen... 5-56ml by mouth qhs as needed cough disp: 60ml    Ventolin Hfa 108 (90 Base) Mcg/act Aers (Albuterol sulfate) .Marland Kitchen... 2 puffs every 4 hours as needed for sob; disp 1 inhaler  Orders: [facility]- Est  Level 4 (99214)  Complete Medication List: 1)  Lexapro 20 Mg Tabs (Escitalopram oxalate) .Marland Kitchen.. 1 by mouth daily 2)  Wellbutrin Xl 150 Mg Xr24h-tab (Bupropion hcl) .Marland Kitchen.. 1 by mouth daily 3)  Klonopin 1 Mg Tabs (Clonazepam) .Marland Kitchen.. 1 by mouth tid 4)  Ambien 10 Mg Tabs (Zolpidem tartrate) .Marland Kitchen.. 1 by mouth qhs 5)  Penicillin V Potassium 500 Mg Tabs (Penicillin v potassium) .... Take 1 tab by mouth three times a day 6)  Hydrocodone-acetaminophen 5-500 Mg Tabs (Hydrocodone-acetaminophen) .... One by mouth q6h as needed pain 7)  Pravastatin Sodium 40 Mg Tabs (Pravastatin sodium) .Marland KitchenMarland KitchenMarland Kitchen  Take 1 pill by mouth daily 8)  Azithromycin 500 Mg Tabs (Azithromycin) .... One by mouth daily for 7 days 9)  Hydrocodone-homatropine 5-1.5 Mg/24ml Syrp (Hydrocodone-homatropine) .... 5-90ml by mouth qhs as needed cough disp: 60ml 10)  Ventolin Hfa 108 (90 Base) Mcg/act Aers (Albuterol sulfate) .... 2 puffs every 4 hours as needed for sob; disp 1 inhaler 11)  Prednisone 20 Mg Tabs (Prednisone) .... 2 tabs (40mg ) by mouth daily for 7 days 12)  Fluticasone Propionate 50 Mcg/act Susp (Fluticasone propionate) .... 2 sprays each nostril daily for 7-10 days  Other Orders: Pulse Oximetry- [facility] ([address]109)  Patient Instructions: 1)  Your CXR was clear - you do not have pneumonia.  Your  symptoms and your xray fit with Acute Bronchitis - inflammation in the lungs causes by a virus. 2)  Unfortunately, it can take 3-4 weeks for a bronchitis cough to go away.   3)  To help with the symptoms, take the prednisone for 1 week.  Use the nasal spray for at least 1 week or until your cough is resolved. 4)  You can continue to use the cough syrup - it is the best one available.  I'm sorry it hasn't worked well for you yet, but hopefully the prednisone should calm things down enough that the syrup will help you more. Prescriptions: HYDROCODONE-HOMATROPINE 5-1.5 MG/5ML SYRP (HYDROCODONE-HOMATROPINE) 5-28mL by mouth qHS as needed cough disp: 60mL  #1 x 1   Entered and Authorized by:   Ardeen Garland  MD   Signed by:   Ardeen Garland  MD on 09/05/2009   Method used:   Print then Give to Patient   RxID:   [id number] FLUTICASONE PROPIONATE 50 MCG/ACT SUSP (FLUTICASONE PROPIONATE) 2 sprays each nostril daily for 7-10 days  #1 x 1   Entered and Authorized by:   Ardeen Garland  MD   Signed by:   Ardeen Garland  MD on 09/05/2009   Method used:   Faxed to ...       [facility] Department (retail)       [address] [place], [place]  [address]       Ph: [phone]       Fax: [phone]   RxID:   [id number] PREDNISONE 20 MG TABS (PREDNISONE) 2 tabs (40mg ) by mouth daily for 7 days  #14 x 0   Entered and Authorized by:   Ardeen Garland  MD   Signed by:   Ardeen Garland  MD on 09/05/2009   Method used:   Faxed to ...       [facility] Department (retail)       [address] [place], [place]  [address]       Ph: [phone]       Fax: [phone]   RxID:   [id number]
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
## 2010-09-05 NOTE — Progress Notes (Signed)
Summary: wait. for pt to call back/ts  Phone Note Call from Patient Call back at Home Phone [phone]   Caller: Patient Summary of Call: Would like to talk to Dr. Janalyn Harder concerning several things about his appointment today. Initial call taken by: Clydell Hakim,  February 07, 2010 11:16 AM  Follow-up for Phone Call        CALLED PT. Lmvm to call back. Please ask pt ???? Follow-up by: Arlyss Repress CMA,,  February 07, 2010 12:24 PM  Additional Follow-up for Phone Call Additional follow up Details #1::        Called pt back.   Pt states: "Today is the first day that I am the patient, and you are the doctor.  And it may seem that I was the doctor and not taking your advise.  I want to apologize for that." Told patient that I appreciate his call and his apology.  I reinterated that I would like to see him for HTN and to f/u his labs.  Pt agreed.   Additional Follow-up by: Mara Favero MD,  February 07, 2010 3:14 PM

## 2010-09-05 NOTE — Assessment & Plan Note (Signed)
Summary: bp check/eo  Nurse Visit   Vital Signs:  Patient profile:   45 year old male BP sitting:   129 / 86  (left arm) Cuff size:   large  Vitals Entered By: Loralee Pacas CMA (October 25, 2009 2:21 PM)  Allergies: No Known Drug Allergies  Orders Added: 1)  Est Level 1- [facility] [16109]
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
## 2010-09-05 NOTE — Progress Notes (Signed)
Summary: phn msg  Phone Note Call from Patient Call back at Home Phone [phone]   Caller: Patient Summary of Call: pt is requesting to speak w/ nurse about referral Initial call taken by: De Nurse,  September 01, 2009 2:54 PM  Follow-up for Phone Call        spoke with patient and states he has appointment with Dr. Zachery Dakins of [facility] for 09/08/2009. Follow-up by: Theresia Lo RN,  September 01, 2009 4:20 PM

## 2010-09-05 NOTE — Miscellaneous (Signed)
  No opiates.  Mr Q violated his opiate agreement. Clementeen Graham Clinical Lists Changes

## 2010-09-05 NOTE — Miscellaneous (Signed)
Summary: Adjusting Prob List   Clinical Lists Changes  Problems: Removed problem of INGUINAL HERNIA, HX OF (ICD-V13.8) Removed problem of UMBILICAL HERNIA, HX OF (ICD-V13.8) Removed problem of FRACTURED TOOTH ([id number])

## 2010-09-05 NOTE — Miscellaneous (Signed)
Summary: Patient Dissatisfaction  Clinical Lists Changes I qwas Preceptor for Dr Janalyn Harder who appropriately came and got me when [name] became upset at his ov. He wanted her to giove him a rx for some bezodiazepines as he has been feeling "stressed" and anxious. WHen informed that we were leaving all of his psychiatric treatment to his psychiatrist at [facility], he became agitated. He argued with me and bordered on the line of being threatening. I summarized by telling him we--[facility]--would NOT be providing him  with his psychiatric meds and this included any Rx for Benzodiazepines. He informed me that he might just go home and "tear up my room" at his house because he was so stressed and that if I did not give hiom these meds he would just go the ED.   I tried to explain to him that his lsit of narcotic Rx had been faxed to [place] by the ED and that he was not going to get any more narcotics from [place] either.  He  then requested a  physician change back to Dr Denyse Amass from Dr Janalyn Harder and I told him I was not sure that could be accomplished and that I would transfer his request to Dr Denyse Amass and to Dr Deirdre Priest.  He walked out, obviously angry.

## 2010-09-05 NOTE — Progress Notes (Signed)
Summary: meds question  Phone Note Call from Patient Call back at Home Phone [phone]   Caller: Patient Summary of Call: needs to talk to nurse about his BP meds Initial call taken by: De Nurse,  October 13, 2009 10:15 AM  Follow-up for Phone Call        he picked up the combo bp pill & wants to know if he should take it since he is already on HCTZ. told him in pt instructions it said to stop the hctz when he starts the combo pill. also recommended no added salt, read labels carefully & try to avoid high sodium foods. told him salt is high in restaurant meals & fast food.. to exercise & drink plenty of water. try to lower stress levels which can play a part. states yesterday was a "wakeup call" Follow-up by: Golden Circle RN,  October 13, 2009 10:19 AM

## 2010-09-05 NOTE — Progress Notes (Signed)
Summary: pls call  Phone Note Call from Patient Call back at Home Phone [phone]   Caller: Patient Summary of Call: pt wanted to have dr Denyse Amass to call after surgery- surgery was yesterday and things went well. Initial call taken by: De Nurse,  September 23, 2009 1:39 PM

## 2010-09-05 NOTE — Progress Notes (Signed)
Summary: triage  Phone Note Call from Patient Call back at Home Phone [phone]   Caller: Patient Summary of Call: Pt calling again about if he should be keeping his appointment with Dr. Denyse Amass on Friday. Initial call taken by: Clydell Hakim,  November 08, 2009 4:52 PM  Follow-up for Phone Call        I had already told him to ask pcp tomorrow about that. asked pcp to call him back Follow-up by: Golden Circle RN,  November 08, 2009 4:55 PM  Additional Follow-up for Phone Call Additional follow up Details #1::        Will see Dr. Georgiana Shore on Friday.   Will see me next Wednesday for tooth pain. Additional Follow-up by: Clementeen Graham MD,  November 08, 2009 5:03 PM
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
## 2010-09-05 NOTE — Progress Notes (Signed)
Summary: triage  Phone Note Call from Patient Call back at Home Phone [phone]   Caller: Patient Summary of Call: nothing is helping with his tooth pain and needs some help Initial call taken by: De Nurse,  October 06, 2009 11:29 AM  Follow-up for Phone Call        to pcp to handle Follow-up by: Golden Circle RN,  October 06, 2009 11:47 AM  Additional Follow-up for Phone Call Additional follow up Details #1::        told him pcp is already full & cannot see him today. offered him an appt with another md, but told him that md will not be prescribing narcotics states he does not want narcotics, but wants to speak with pcp "about what has been going on over the past week" wants to talk with pcp only. told him pcp will be here this afternoon & I will ask him to call back. [phone] Additional Follow-up by: Golden Circle RN,  October 07, 2009 8:52 AM    Additional Follow-up for Phone Call Additional follow up Details #2::    Asking to speak to [facility] again. Follow-up by: Clydell Hakim,  October 07, 2009 10:21 AM  Additional Follow-up for Phone Call Additional follow up Details #3:: Details for Additional Follow-up Action Taken: he wanted to make sure we did not think he was a narcotic seeking person. I told him that I was aware of him being in pain & most people was pain meds . wanted to make sue that he knew that Dr. Denyse Amass was the one controlling pain meds. we do not have him labelled as narcotic seeker  he wants to make sure pcp calls him today. told him I have sent a message & marked it urgent Additional Follow-up by: Golden Circle RN,  October 07, 2009 12:00 PM    Called Mr Pernell back and discussed his care.  He will see me as soon as possible.

## 2010-09-05 NOTE — Assessment & Plan Note (Signed)
Summary: anxiety,tcb   Vital Signs:  Patient profile:   45 year old male Height:      72 inches Weight:      239.8 pounds BMI:     32.64 Pulse rate:   84 / minute BP sitting:   132 / 90  (left arm)  Vitals Entered By: Angeline Slim MD (February 27, 2010 9:38 AM) CC: increased anxiety due to situation (no job, living with mom x 1 year). used lorazepam from mom once due to increased anxiety. Is Patient Diabetic? No Pain Assessment Patient in pain? no        Primary Care Provider:  Ellakate Gonsalves MD  CC:  increased anxiety due to situation (no job and living with mom x 1 year). used lorazepam from mom once due to increased anxiety.Marland Kitchen  History of Present Illness: 45 y/o M here for Anxiety:  He has been out of work > 49yr and this is the cause of much of his anxiety.    Was seen by Psychiatrist on7/18 [facility]).  Pt states that he was not given Benzo by them because they did not feel that he was in an extreme case.  They did not feel that his situation warrants benzo.  [facility] is considering him for daily counseling.  They made changes to his medications: Lexapro 20mg  from one tab to 1 1/2 tab per day (up from 1 tab daily). Welbutrin xl 150mg  two times a day (same dose). Trazadone:  was recently given by [facility], but he states that he cannot take it because it makes him feel that he "is in a coma."  He does not take this.    He is living with his mom and states that his anxiety is causing him to take "it out on his mom".  He yells at her.  Mother present during this office visit and states that "you need to give him medication so that he does not take it out on me anymore."  When asked if she feels safe in her house, mom states that she can leave the house when he gets angry.    Pt states: "If you don't give me omething to help I am going to be forced to go to the emergency room."  Regarding his Anxiety: " I feel pressure.  I feel like I am going to help a heart attack.  I get cold  sweats.  This is classic for anxiety."     Typical day:  Gets up 8-10:30.  get on comupter looking for job.  Calls old contacts.  Follow-up on places he sent resume to.  Goes to [employer] sometimes for job search.  He does not drive, this limits where he can get a job.  Was told he was over qualified by [facility].   Was told that the position was an entry level position so he would get bored and quit.  This caused him increased "anxiety".      Habits & Providers  Alcohol-Tobacco-Diet     Tobacco Status: never  Current Medications (verified): 1)  Lexapro 20 Mg Tabs (Escitalopram Oxalate) .Marland Kitchen.. 1 1/2 By Mouth Daily Per [facility] 2)  Wellbutrin Xl 150 Mg Xr24h-Tab (Bupropion Hcl) .Marland Kitchen.. 1 By Mouth Bid 3)  Ambien 10 Mg Tabs (Zolpidem Tartrate) .Marland Kitchen.. 1 By Mouth Qhs 4)  Pravastatin Sodium 40 Mg Tabs (Pravastatin Sodium) .... Take 1 Pill By Mouth Daily 5)  Prilosec 20 Mg Cpdr (Omeprazole) .Marland Kitchen.. 1 By Mouth Daily  6)  Zestoretic 20-25 Mg Tabs (Lisinopril-Hydrochlorothiazide) .Marland Kitchen.. 1 Tab By Mouth Daily For Blood Pressure 7)  Norvasc 10 Mg Tabs (Amlodipine Besylate) .Marland Kitchen.. 1 By Mouth Daily At Night  Allergies (verified): No Known Drug Allergies  Physical Exam  General:  Well-developed,well-nourished,in no acute distress; alert,appropriate and cooperative throughout examination Psych:  AOx3.  Agitated.  Angry appearing,. raises his voice very often.  Tries to talk over me during exam.  Not suicidal, and not homicidal.  Oriented X3.     Impression & Recommendations:  Problem # 1:  ANXIETY DEPRESSION (ICD-300.4) Pt does not accept that Benzos are not long term answers for his condition.  Given that he has had narcotic seeking behavior in the past and violated pain contract with Dr Denyse Amass, I reinterated that I will not prescribe Benzos for him.   He becomes angry with suggestion of other meds, Hydroxyzine, for anxiety or adding SSRI.  He refers to these as "herbal alternative" and implied they  were pseudo-meds.  Offered that he may benefit from counseling for anger management and coping mechanism.  Pt states that [facility] would get him counseling, but does not know when it would start because he has to go on waiting list.  Offered counseling services here.   I precept the patient with Dr Jennette Kettle.  She spoke with patient and reinterated that he will NOT get narcotics or Benzos from our clinic.  Pt agreed to this.  He would like to continue to get medical care here, but has requested to be assigned to another physician.   Orders: [facility]- Est Level  3 (16109)  Complete Medication List: 1)  Lexapro 20 Mg Tabs (Escitalopram oxalate) .Marland Kitchen.. 1 1/2 by mouth daily per [facility] 2)  Wellbutrin Xl 150 Mg Xr24h-tab (Bupropion hcl) .Marland Kitchen.. 1 by mouth bid 3)  Ambien 10 Mg Tabs (Zolpidem tartrate) .Marland Kitchen.. 1 by mouth qhs 4)  Pravastatin Sodium 40 Mg Tabs (Pravastatin sodium) .... Take 1 pill by mouth daily 5)  Prilosec 20 Mg Cpdr (Omeprazole) .Marland Kitchen.. 1 by mouth daily 6)  Zestoretic 20-25 Mg Tabs (Lisinopril-hydrochlorothiazide) .Marland Kitchen.. 1 tab by mouth daily for blood pressure 7)  Norvasc 10 Mg Tabs (Amlodipine besylate) .Marland Kitchen.. 1 by mouth daily at night  Appended Document: anxiety,tcb I spoke with Mr Biel was very disruptive and verged on aggressive and threatening. He is unhappy because we will not Rx him so benzodiazepines. See my clinical update for details
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
## 2010-09-05 NOTE — Progress Notes (Signed)
Summary: refill  Phone Note Refill Request Call back at Home Phone [phone] Message from:  Patient  Refills Requested: Medication #1:  AMBIEN 10 MG TABS 1 by mouth qhs pls let pt know when this is called in [facility]  Initial call taken by: De Nurse,  November 29, 2009 2:22 PM  Follow-up for Phone Call        to pcp Follow-up by: Golden Circle RN,  November 29, 2009 2:26 PM  Additional Follow-up for Phone Call Additional follow up Details #1::        Will call [facility] pharmacy [phone] and replace the [employer].   Has date for root cannal May 30th.   Additional Follow-up by: Clementeen Graham MD,  November 30, 2009 11:40 AM

## 2010-09-05 NOTE — Progress Notes (Signed)
Summary: meds prob  Phone Note Call from Patient Call back at Home Phone [phone]   Caller: Patient Summary of Call: pt states that [facility] health dept does not have NABUMETONE 500 MG TABS - pls advise Initial call taken by: De Nurse,  September 28, 2009 3:28 PM  Follow-up for Phone Call        called patient and pharmacy. Issue resolved.

## 2010-09-05 NOTE — Progress Notes (Signed)
  Phone Note Call from Patient   Caller: Patient Summary of Call: Timothy Lowe want to have labs when he come in for his appt on 11/1 Initial call taken by: Abundio Miu,  May 25, 2010 1:40 PM     Appended Document:  pt will be fasting when he comes for appt.

## 2010-09-05 NOTE — Assessment & Plan Note (Signed)
Summary: diarrhea,cough worse,chest congestion/lgk/corey   Vital Signs:  Patient profile:   45 year old male Height:      70.75 inches O2 Sat:      92 % Temp:     98.4 degrees F oral Pulse rate:   85 / minute BP sitting:   108 / 75  (left arm) Cuff size:   regular  Vitals Entered By: Tessie Fass CMA (September 02, 2009 8:39 AM) CC: cough, congestion, weezing, vomiting and diarrhea x 3 days Is Patient Diabetic? No Pain Assessment Patient in pain? no        Primary Care Provider:  Clementeen Graham  CC:  cough, congestion, weezing, and vomiting and diarrhea x 3 days.  History of Present Illness: 1. cough, congestion, weezing x 4 days. Seen 08/29/09 for the same but symptoms have progressed. Cough medicine hasn't helped. HA has started to become more pronounced although cough is most bothersome. Cough is productive of clear mucus. Wheezing is worse at night.  ROS: no fever/chills; has had some post-tussive nausea and vomiting; occasional mild diarrhea. No rash.  Habits & Providers  Alcohol-Tobacco-Diet     Tobacco Status: never  Current Medications (verified): 1)  Lexapro 20 Mg Tabs (Escitalopram Oxalate) .Marland Kitchen.. 1 By Mouth Daily 2)  Wellbutrin Xl 150 Mg Xr24h-Tab (Bupropion Hcl) .Marland Kitchen.. 1 By Mouth Daily 3)  Klonopin 1 Mg Tabs (Clonazepam) .Marland Kitchen.. 1 By Mouth Tid 4)  Ambien 10 Mg Tabs (Zolpidem Tartrate) .Marland Kitchen.. 1 By Mouth Qhs 5)  Penicillin V Potassium 500 Mg Tabs (Penicillin V Potassium) .... Take 1 Tab By Mouth Three Times A Day 6)  Hydrocodone-Acetaminophen 5-500 Mg Tabs (Hydrocodone-Acetaminophen) .... One By Mouth Q6h As Needed Pain 7)  Pravastatin Sodium 40 Mg Tabs (Pravastatin Sodium) .... Take 1 Pill By Mouth Daily 8)  Azithromycin 500 Mg Tabs (Azithromycin) .... One By Mouth Daily For 7 Days 9)  Hydrocodone-Homatropine 5-1.5 Mg/59ml Syrp (Hydrocodone-Homatropine) .... One Teaspoon By Mouth Every 12 Hours As Needed; Disp 60 Cc 10)  Ventolin Hfa 108 (90 Base) Mcg/act Aers (Albuterol  Sulfate) .... 2 Puffs Every 4 Hours As Needed For Sob; Disp 1 Inhaler  Allergies (verified): No Known Drug Allergies  Physical Exam  Additional Exam:  General:  Vital signs reviewed -- afebrile -- O2 sat 92% Alert, appropriate; well-dressed and well-nourished; somewhat ill-appearing. Lungs:  work of breathing mildly labored. Insp and exp wheeze throughout, worse on L side. Good air movement in all lung fields. Active cough. Heart:  regular rate and rhythm, no murmurs; normal s1/s2 Pulses:  DP and radial pulses 2+ bilaterally  Extremities:  no cyanosis, clubbing, or edema Neurologic:  alert and oriented. speech normal. Psych: full affect, normally interactive. Good eye contact.     Impression & Recommendations:  Problem # 1:  COUGH (ICD-786.2) Assessment Deteriorated given progression of symptoms and pronounced wheeze on exam will treat with albuterol, azithro and check chest xray. Will change cough medication to hycodan to see if this helps, don't use with vicodin. Supportive care.  See instructions.. Return parameters discussed.  Patient/family agreeable. See instructions   Orders: CXR- 2view (CXR) [facility]- Est  Level 4 (61443)  Complete Medication List: 1)  Lexapro 20 Mg Tabs (Escitalopram oxalate) .Marland Kitchen.. 1 by mouth daily 2)  Wellbutrin Xl 150 Mg Xr24h-tab (Bupropion hcl) .Marland Kitchen.. 1 by mouth daily 3)  Klonopin 1 Mg Tabs (Clonazepam) .Marland Kitchen.. 1 by mouth tid 4)  Ambien 10 Mg Tabs (Zolpidem tartrate) .Marland Kitchen.. 1 by mouth qhs 5)  Penicillin V  Potassium 500 Mg Tabs (Penicillin v potassium) .... Take 1 tab by mouth three times a day 6)  Hydrocodone-acetaminophen 5-500 Mg Tabs (Hydrocodone-acetaminophen) .... One by mouth q6h as needed pain 7)  Pravastatin Sodium 40 Mg Tabs (Pravastatin sodium) .... Take 1 pill by mouth daily 8)  Azithromycin 500 Mg Tabs (Azithromycin) .... One by mouth daily for 7 days 9)  Hydrocodone-homatropine 5-1.5 Mg/38ml Syrp (Hydrocodone-homatropine) .... One teaspoon by mouth  every 12 hours as needed; disp 60 cc 10)  Ventolin Hfa 108 (90 Base) Mcg/act Aers (Albuterol sulfate) .... 2 puffs every 4 hours as needed for sob; disp 1 inhaler  Patient Instructions: 1)  Increase the mucinex to 1200 mg two times a day with lots of water. 2)  Try the new cough syrup. Do not take with vicodin. 3)  take the antibiotic once a day for 7 days. 4)  Use the inhaler for wheezing. 5)  Take ibuprofen 800 mg three times a day as needed for headache, achiness. 6)  follow-up if not some better in 2-3 days. Prescriptions: AZITHROMYCIN 500 MG TABS (AZITHROMYCIN) one by mouth daily for 7 days  #7 x 0   Entered and Authorized by:   Myrtie Soman  MD   Signed by:   Myrtie Soman  MD on 09/02/2009   Method used:   Faxed to ...       [facility] Department (retail)       [address] [place], [place]  [address]       Ph: [phone]       Fax: [phone]   RxID:   [id number] VENTOLIN HFA 108 (90 BASE) MCG/ACT AERS (ALBUTEROL SULFATE) 2 puffs every 4 hours as needed for SOB; disp 1 inhaler  #1 x 0   Entered and Authorized by:   Myrtie Soman  MD   Signed by:   Myrtie Soman  MD on 09/02/2009   Method used:   Faxed to ...       [facility] Department (retail)       [address] [place], [place]  [address]       Ph: [phone]       Fax: [phone]   RxID:   [id number] VENTOLIN HFA 108 (90 BASE) MCG/ACT AERS (ALBUTEROL SULFATE) 2 puffs every 4 hours as needed for SOB; disp 1 inhaler  #1 x 0   Entered and Authorized by:   Myrtie Soman  MD   Signed by:   Myrtie Soman  MD on 09/02/2009   Method used:   Electronically to        [facility] [phone]* (retail)       [address]       [address]       [place], [place]  [address]       Ph: [phone]       Fax: [phone]   RxID:   [id number] HYDROCODONE-HOMATROPINE 5-1.5 MG/5ML SYRP (HYDROCODONE-HOMATROPINE) one teaspoon by mouth every 12 hours as needed;  disp 60 cc  #1 x 0   Entered and Authorized by:   Myrtie Soman  MD   Signed by:   Myrtie Soman  MD on 09/02/2009   Method used:   Print then Give to Patient   RxID:   [id number] HYDROCODONE-HOMATROPINE 5-1.5 MG/5ML SYRP (HYDROCODONE-HOMATROPINE) one teaspoon by mouth every 12 hours as needed; disp 60 cc  #1 x 0  Entered and Authorized by:   Myrtie Soman  MD   Signed by:   Myrtie Soman  MD on 09/02/2009   Method used:   Print then Give to Patient   RxID:   [id number] VENTOLIN HFA 108 (90 BASE) MCG/ACT AERS (ALBUTEROL SULFATE) 2 puffs every 4 hours as needed for SOB; disp 1 inhaler  #1 x 0   Entered and Authorized by:   Myrtie Soman  MD   Signed by:   Myrtie Soman  MD on 09/02/2009   Method used:   Print then Give to Patient   RxID:   [id number] AZITHROMYCIN 500 MG TABS (AZITHROMYCIN) one by mouth daily for 7 days  #7 x 0   Entered and Authorized by:   Myrtie Soman  MD   Signed by:   Myrtie Soman  MD on 09/02/2009   Method used:   Electronically to        [facility] [phone]* (retail)       [address]       [address]       [place], [place]  [address]       Ph: [phone]       Fax: [phone]   RxID:   [id number]

## 2010-09-05 NOTE — Consult Note (Signed)
[facility]   Imported By: Abundio Miu 10/13/2009 13:32:54  _____________________________________________________________________  External Attachment:    Type:   Image     Comment:   External Document

## 2010-09-05 NOTE — Assessment & Plan Note (Signed)
Summary: cough,df(resch'd from [facility]   Vital Signs:  Patient profile:   45 year old male Weight:      236.7 pounds Temp:     98.7 degrees F oral Pulse rate:   86 / minute BP sitting:   126 / 87  (left arm) Cuff size:   regular  Vitals Entered By: Loralee Pacas CMA (August 29, 2009 12:21 PM) CC: cough Comments pt has had a persistant/ non productive cough since thursday, he has been taking mucinex, robutussin with no relief   Primary Care Provider:  Clementeen Graham  CC:  cough.  History of Present Illness: 1) Cough: Dry, hacking cough x 4 days. Occasional clear phlegm. Throat hurts from coughing, but no sore throat specifically. Endorses tickling sensation in throat, post-nasal drip. Has tried Mucinex, Robitussin, Tessalon w/o relief. Denies dyspnea, rhinorrhea, chest pain, sick contacts, nasuea, vomiting, diarrhea, h/o asthma or tobacco use, GERD symptoms currently.   Current Medications (verified): 1)  Lexapro 20 Mg Tabs (Escitalopram Oxalate) .Marland Kitchen.. 1 By Mouth Daily 2)  Wellbutrin Xl 150 Mg Xr24h-Tab (Bupropion Hcl) .Marland Kitchen.. 1 By Mouth Daily 3)  Klonopin 1 Mg Tabs (Clonazepam) .Marland Kitchen.. 1 By Mouth Tid 4)  Ambien 10 Mg Tabs (Zolpidem Tartrate) .Marland Kitchen.. 1 By Mouth Qhs 5)  Penicillin V Potassium 500 Mg Tabs (Penicillin V Potassium) .... Take 1 Tab By Mouth Three Times A Day 6)  Oxycodone-Acetaminophen 10-325 Mg Tabs (Oxycodone-Acetaminophen) .Marland Kitchen.. 1 By Mouth Q6 Hrs As Needed Severe Pain. 7)  Pravastatin Sodium 40 Mg Tabs (Pravastatin Sodium) .... Take 1 Pill By Mouth Daily 8)  Guiatuss Ac 100-10 Mg/32ml Syrp (Guaifenesin-Codeine) .Marland Kitchen.. 10 Ml By Mouth Q 4 Hrs As Needed Cough  Allergies (verified): No Known Drug Allergies  Physical Exam  General:  VS noted and rechecked and found to be normal. Well appearing in NAD Nose:  mild rhinorrhea  Mouth:  post nasal drip noted, no erythema or exudate or tonisllar hypertrophy  Neck:  no lymphadenopathy   Lungs:  CTAB  Heart:  RRR no murmurs    Extremities:  no edema    Impression & Recommendations:  Problem # 1:  COUGH (ICD-786.2) Assessment New  Likely viral cough at this time given presentation and exam. Will treat with guaifenesin/codeine cough syrup. Advised follow up with PCP if not improving. Consider GERD, allergic rhinitis as possible etiologies if becomes chronic given history.   Orders: [facility]- Est Level  3 (09811)  Complete Medication List: 1)  Lexapro 20 Mg Tabs (Escitalopram oxalate) .Marland Kitchen.. 1 by mouth daily 2)  Wellbutrin Xl 150 Mg Xr24h-tab (Bupropion hcl) .Marland Kitchen.. 1 by mouth daily 3)  Klonopin 1 Mg Tabs (Clonazepam) .Marland Kitchen.. 1 by mouth tid 4)  Ambien 10 Mg Tabs (Zolpidem tartrate) .Marland Kitchen.. 1 by mouth qhs 5)  Penicillin V Potassium 500 Mg Tabs (Penicillin v potassium) .... Take 1 tab by mouth three times a day 6)  Oxycodone-acetaminophen 10-325 Mg Tabs (Oxycodone-acetaminophen) .Marland Kitchen.. 1 by mouth q6 hrs as needed severe pain. 7)  Pravastatin Sodium 40 Mg Tabs (Pravastatin sodium) .... Take 1 pill by mouth daily 8)  Guiatuss Ac 100-10 Mg/24ml Syrp (Guaifenesin-codeine) .Marland Kitchen.. 10 ml by mouth q 4 hrs as needed cough Prescriptions: GUIATUSS AC 100-10 MG/5ML SYRP (GUAIFENESIN-CODEINE) 10 ml by mouth q 4 hrs as needed cough  #100 x 0   Entered and Authorized by:   Bobby Rumpf  MD   Signed by:   Bobby Rumpf  MD on 08/29/2009   Method used:   Print then  Give to Patient   RxID:   [id number]
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
## 2010-09-05 NOTE — Progress Notes (Signed)
Summary: Ambien Prescription Problem  Phone Note Call from Patient   Caller: Patient Call For: [phone] Summary of Call: Pt requesting a call back as soon as possible. Initial call taken by: Abundio Miu,  June 07, 2010 3:27 PM  Follow-up for Phone Call        Spoke with patient.  He assures me has been getting [facility].    I spoke with them a prescription for Ambien written by Dr Denyse Amass was transfered to them [facility].  It was originally written on 4/27 by Dr Denyse Amass. [facility] filled on 7/20 and 8/16 and 9/12.  Apparently this Rx was not recorded in our Medication List  I would recomment to Dr Janalyn Harder to write an Ambien rx for 30 PRN with no refill and ask patient to make an appointment to discuss long term use  Follow-up by: Pearlean Brownie MD,  June 08, 2010 9:26 AM  Additional Follow-up for Phone Call Additional follow up Details #1::        Will presribed for 1 month as recommended by Dr Deirdre Priest.  Pt will need appt to discuss longterm use.  I will leave Rx up front for him to pick up.  Additional Follow-up by: Cat Ta MD,  June 08, 2010 10:06 AM    New/Updated Medications: AMBIEN 10 MG TABS (ZOLPIDEM TARTRATE) 1 tab by mouth at bedtime as needed insomnia Prescriptions: AMBIEN 10 MG TABS (ZOLPIDEM TARTRATE) 1 tab by mouth at bedtime as needed insomnia  #30 x 0   Entered by:   Cat Ta MD   Authorized by:   Pearlean Brownie MD   Signed by:   Angeline Slim MD on 06/08/2010   Method used:   Print then Give to Patient   RxID:   [id number]

## 2010-09-05 NOTE — Progress Notes (Signed)
Summary: Rx  Phone Note Refill Request Call back at Home Phone [phone]   Refills Requested: Medication #1:  AMBIEN 10 MG TABS 1 tab by mouth at bedtime as needed insomnia. Initial call taken by: Knox Royalty,  July 10, 2010 9:02 AM  Follow-up for Phone Call        appt scheduled 12/8 at 11 Follow-up by: Knox Royalty,  July 10, 2010 10:38 AM    As discussed with pt by Dr Deirdre Priest, this Rx was refilled one time and he needs to come in for appt to disucss longterm use.  Cat Ta MD  July 10, 2010 9:06 AM

## 2010-09-05 NOTE — Miscellaneous (Signed)
Summary: needs refill on zolpidem  Clinical Lists Changes rec'd fax from [facility] [address] asking for refill on zolpidem 10mg  #30. to pcp.Golden Circle RN  May 16, 2010 12:39 PM     No refill.  Pt needs appt with pcp.  This was faxed earlier this week. Cat Ta MD  May 16, 2010 1:46 PM  Appended Document: needs refill on zolpidem LM asking him to call & make appt to see pcp

## 2010-09-05 NOTE — Progress Notes (Signed)
Summary: phn msg  Phone Note Call from Patient Call back at [phone]   Caller: Patient Summary of Call: needs to speak w/ director to discuss his situation and wants a hearing to be heard Initial call taken by: De Nurse,  January 16, 2010 8:40 AM  Follow-up for Phone Call        rec'd call from Angie at [facility]. he keeps calling there asking for pain meds. They are not giving him any pain meds. she states that he has had 3 appts and cancelled all three. he has one more this thursday. if he does not keep it, they will not give him any more appts there.  Follow-up by: Golden Circle RN,  January 16, 2010 10:28 AM  Additional Follow-up for Phone Call Additional follow up Details #1::        There is nothing to talk about.  Until he goes for his dental appt we will not hold any meeting with him.   Additional Follow-up by: Dennison Nancy RN,  January 16, 2010 12:25 PM    Additional Follow-up for Phone Call Additional follow up Details #2::    pt is upset that some of the conversations that he discussed with Dr Denyse Amass has not been documented and needs to talk with Dr Deirdre Priest - doesn't want things to go on his record that he thinks is false. Follow-up by: De Nurse,  January 16, 2010 1:54 PM  Additional Follow-up for Phone Call Additional follow up Details #3:: Details for Additional Follow-up Action Taken: I called as the patient as the program director.  I listened.  His concerns were: 1. Primarily, he does not want to be labeled as a drug abusing patient.  He feels the instances where he got pain meds from providers other than Dr. Denyse Amass were for his umbilical hernia repair and for narcotic cough medicine.  He feels he did not violate the spirit of the contract. 2. Secondarily, he would like pain meds to last until tooth extraction in 3 days plus 2 days beyond for recovery.  I said no. I did explain it was vital for him to keep dental appointment Thursday.  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 My review of chart reveils ample evidence to support the no narcotic decision.  He has had multiple visits and phone calls over the last 6 months, almost all of which have to do with pain medications.  Also, it is documented several times that he expressed anger about such refusals. He told me he will likely seek care elsewhere.  I wished him luck on his tooth extraction and expressed hope that his six month ordeal would be over in a vew days.   Additional Follow-up by: Doralee Albino MD,  January 16, 2010 4:35 PM   Appended Document: phn msg patient calls stating he could not wait for Dr. Leveda Anna yesterday while they were talking to type his note , then read to him what he typed.  he calls today requesting to  be read the note that was typed. RN read note to patient and he agreed that this is what they discussed yesterday. he is still concerned however that if he presents to out office with an issue and he clearly  needs pain medication will he be denied, ( he gave example of  "say I have a broken leg "). advised he will need to discuss this with Dr. Denyse Amass. patient states he would like to be switched to another doctor. advised will send message to Dennison Nancy RN .

## 2010-09-05 NOTE — Progress Notes (Signed)
Summary: refill  Phone Note Refill Request Call back at Home Phone [phone] Message from:  Patient  Refills Requested: Medication #1:  AMBIEN 10 MG TABS 1 by mouth qhs [facility]  Initial call taken by: De Nurse,  August 29, 2009 3:09 PM  Follow-up for Phone Call        to pcp Follow-up by: Golden Circle RN,  August 29, 2009 3:11 PM  Additional Follow-up for Phone Call Additional follow up Details #1::        Pt calling back again about rx. Additional Follow-up by: Clydell Hakim,  August 29, 2009 4:24 PM    Additional Follow-up for Phone Call Additional follow up Details #2::    pt called and said he ran out over the weekend. Follow-up by: De Nurse,  August 30, 2009 8:32 AM  Additional Follow-up for Phone Call Additional follow up Details #3:: Details for Additional Follow-up Action Taken: md not in this wk. given to preceptor Additional Follow-up by: Golden Circle RN,  August 30, 2009 8:44 AM
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
## 2010-09-05 NOTE — Progress Notes (Signed)
Summary: meds refill  Phone Note Refill Request Message from:  Patient  Refills Requested: Medication #1:  OXYCODONE-ACETAMINOPHEN 10-325 MG TABS 1 by mouth q6 hrs as needed severe pain. pt will be running out of meds this Thursday - wants to know if it can be refilled until he can get into dental clinic. he is coming in this AM to see Humberto Seals about a cough, wants to know if he can pick it up then - to be filled on Thurs.  Initial call taken by: De Nurse,  August 29, 2009 9:43 AM  Follow-up for Phone Call        to pcp & S. Saxon who will be seeing him at 11:15 Follow-up by: Golden Circle RN,  August 29, 2009 9:52 AM     Appended Document: meds refill pt wants to know about refill on pain meds.
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
## 2010-09-05 NOTE — Progress Notes (Signed)
Summary: phn msg  Phone Note Call from Patient Call back at Home Phone [phone]   Caller: Patient Summary of Call: Pt wants to talk to Dr Denyse Amass about what is going on with the dentist and about being seen on the 28th of Feb. Initial call taken by: Clydell Hakim,  October 04, 2009 11:15 AM  Follow-up for Phone Call        called pt. pt said, that he wants to speak with dr.corey about his pain contract and update him on the dentist.  he received percocet at the ED one time for his pain. his pain meds will run out on 10-18-09. pt advised, that dr.corey is not in clinic today. pt said, that dr.corey told him that he should call with updates. pt wants dr.corey to call him back. fwd. to dr.corey for review. Follow-up by: Arlyss Repress CMA,,  October 06, 2009 10:41 AM  Additional Follow-up for Phone Call Additional follow up Details #1::        Discusses with the patient.  He will f/u on the 16th

## 2010-09-05 NOTE — Progress Notes (Signed)
Summary: phn msg  Phone Note Call from Patient Call back at [facility] Phone [phone]   Caller: Patient Summary of Call: tooth is swollen and painful and wants to know if he can up the dosage of his hydrocodone to more than one every 6 hrs. Initial call taken by: De Nurse,  September 07, 2009 10:58 AM  Follow-up for Phone Call        told him md had written no more meds for dental pain (Dr. Leveda Anna). suggested getting to the dental place he was told about & being early so they will see him. he is still waiting for the adult dental of [place] to call him. Follow-up by: Golden Circle RN,  September 07, 2009 2:12 PM

## 2010-09-05 NOTE — Letter (Signed)
Summary: Narcotics Fill  Narcotics Fill   Imported By: Clydell Hakim 03/09/2010 15:42:09  _____________________________________________________________________  External Attachment:    Type:   Image     Comment:   External Document

## 2010-09-05 NOTE — Letter (Signed)
Summary: Generic Letter  [name]  [address]   [place], [place] [address]   Phone: [phone]  Fax: [phone]    01/06/2010  Timothy Lowe [address] [place], [place]  [address]  Dear To whom it may concern,  [name] is no longer prescribing opiate pain medications for Timothy Lowe.      Sincerely,   Clementeen Graham MD
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
## 2010-09-07 NOTE — Assessment & Plan Note (Signed)
Summary: acute bronchitis   Vital Signs:  Patient profile:   45 year old male Weight:      249.9 pounds Temp:     98 degrees F oral Pulse rate:   86 / minute BP sitting:   143 / 86  (right arm)  Vitals Entered By: Arlyss Repress CMA, (July 21, 2010 2:10 PM) CC: cough x 8 days. Is Patient Diabetic? No Pain Assessment Patient in pain? no        Primary Care Provider:  Cat Ta MD  CC:  cough x 8 days.Marland Kitchen  History of Present Illness: Cough for 8 days, up all night.  Went to ER and given a neb and cough syrup, helped for one day and now with more severe.  Denies fever, sputum production.  Coughs so hard that he will vomit.  Habits & Providers  Alcohol-Tobacco-Diet     Tobacco Status: never  Current Medications (verified): 1)  Lexapro 20 Mg Tabs (Escitalopram Oxalate) .Marland Kitchen.. 1 1/2 By Mouth Daily Per [facility] 2)  Wellbutrin Xl 150 Mg Xr24h-Tab (Bupropion Hcl) .Marland Kitchen.. 1 By Mouth Per [facility] 3)  Pravastatin Sodium 40 Mg Tabs (Pravastatin Sodium) .... Take 1 Pill By Mouth Daily 4)  Prilosec 20 Mg Cpdr (Omeprazole) .Marland Kitchen.. 1 By Mouth Daily 5)  Zestoretic 20-25 Mg Tabs (Lisinopril-Hydrochlorothiazide) .Marland Kitchen.. 1 Tab By Mouth Daily For Blood Pressure 6)  Norvasc 10 Mg Tabs (Amlodipine Besylate) .Marland Kitchen.. 1 By Mouth Daily At Night 7)  Ambien 10 Mg Tabs (Zolpidem Tartrate) .Marland Kitchen.. 1 Tab By Mouth At Bedtime As Needed Insomnia 8)  Doxycycline Hyclate 100 Mg Tabs (Doxycycline Hyclate) .... One Two Times A Day For 10 Days (There Was An Error in Quantity With Previous Script) 9)  Prednisone 20 Mg Tabs (Prednisone) .... 3 Tabs Daily For 5 Days 10)  Ventolin Hfa 108 (90 Base) Mcg/act Aers (Albuterol Sulfate) .... 2 Puffs Q 4hours Prn  Allergies (verified): No Known Drug Allergies  Review of Systems      See HPI General:  Denies chills and fever. ENT:  Denies nasal congestion and sinus pressure. Resp:  Complains of cough and wheezing.  Physical Exam  General:  Frequent cough Ears:  TM  red and retracted Nose:  inflammed Mouth:  reddened, no post nasal drainage Lungs:  diffuse exp wheezing Heart:  normal rate and regular rhythm.     Impression & Recommendations:  Problem # 1:  BRONCHITIS, ACUTE (ICD-466.0) add 5 days of prednisone with the following, neb treatment with albuterol in the office. His updated medication list for this problem includes:    Doxycycline Hyclate 100 Mg Tabs (Doxycycline hyclate) ..... One two times a day for 10 days (there was an error in quantity with previous script)    Ventolin Hfa 108 (90 Base) Mcg/act Aers (Albuterol sulfate) .Marland Kitchen... 2 puffs q 4hours prn  Orders: [facility]- Est Level  3 (16109)  Complete Medication List: 1)  Lexapro 20 Mg Tabs (Escitalopram oxalate) .Marland Kitchen.. 1 1/2 by mouth daily per [facility] 2)  Wellbutrin Xl 150 Mg Xr24h-tab (Bupropion hcl) .Marland Kitchen.. 1 by mouth per [facility] 3)  Pravastatin Sodium 40 Mg Tabs (Pravastatin sodium) .... Take 1 pill by mouth daily 4)  Prilosec 20 Mg Cpdr (Omeprazole) .Marland Kitchen.. 1 by mouth daily 5)  Zestoretic 20-25 Mg Tabs (Lisinopril-hydrochlorothiazide) .Marland Kitchen.. 1 tab by mouth daily for blood pressure 6)  Norvasc 10 Mg Tabs (Amlodipine besylate) .Marland Kitchen.. 1 by mouth daily at night 7)  Ambien 10 Mg Tabs (Zolpidem tartrate) .Marland KitchenMarland KitchenMarland Kitchen  1 tab by mouth at bedtime as needed insomnia 8)  Doxycycline Hyclate 100 Mg Tabs (Doxycycline hyclate) .... One two times a day for 10 days (there was an error in quantity with previous script) 9)  Prednisone 20 Mg Tabs (Prednisone) .... 3 tabs daily for 5 days 10)  Ventolin Hfa 108 (90 Base) Mcg/act Aers (Albuterol sulfate) .... 2 puffs q 4hours prn  Patient Instructions: 1)  You may use the cough medicine that you received in the ER every 4 hours as needed 2)  The prednisone will attack the inflammation in your lungs 3)  The doxycycline will take care of any infection 4)  Have your albuterol filled if you are still wheezing, it relaxes the airways and sometimes patients need to use  this for several weeks. 5)  Please schedule a follow-up appointment as needed .  Prescriptions: DOXYCYCLINE HYCLATE 100 MG TABS (DOXYCYCLINE HYCLATE) one two times a day for 10 days (there was an error in quantity with previous script)  #20 x 0   Entered and Authorized by:   Luretha Murphy NP   Signed by:   Luretha Murphy NP on 07/21/2010   Method used:   [facility]ronically to        [facility] [address] [phone]* (retail)       [address]       [place], [place]  [address]       Ph: [phone] or [phone]       Fax: [phone]   RxID:   [id number] DOXYCYCLINE HYCLATE 100 MG TABS (DOXYCYCLINE HYCLATE) one two times a day for 10 days  #29 x 0   Entered and Authorized by:   Luretha Murphy NP   Signed by:   Luretha Murphy NP on 07/21/2010   Method used:   Print then Give to Patient   RxID:   [id number] VENTOLIN HFA 108 (90 BASE) MCG/ACT AERS (ALBUTEROL SULFATE) 2 puffs q 4hours prn  #1 x 1   Entered and Authorized by:   Luretha Murphy NP   Signed by:   Luretha Murphy NP on 07/21/2010   Method used:   Print then Give to Patient   RxID:   [id number] VENTOLIN HFA 108 (90 BASE) MCG/ACT AERS (ALBUTEROL SULFATE) 2 puffs q 4hours prn  #1 x 6   Entered and Authorized by:   Luretha Murphy NP   Signed by:   Luretha Murphy NP on 07/21/2010   Method used:   [facility] to        [facility] [address] [phone]* (retail)       [address]       [place], [place]  [address]       Ph: [phone] or [phone]       Fax: [phone]   RxID:   [id number] PREDNISONE 20 MG TABS (PREDNISONE) 3 tabs daily for 5 days  #15 x 0   Entered and Authorized by:   Luretha Murphy NP   Signed by:   Luretha Murphy NP on 07/21/2010   Method used:   [facility]ronically to        [facility] [address] [phone]* (retail)       [address]       [place], [place]  [address]       Ph: [phone] or [phone]  Fax: [phone]   RxID:    [id number] DOXYCYCLINE HYCLATE 100 MG TABS (DOXYCYCLINE HYCLATE) one two times a day for 10 days  #29 x 0   Entered and Authorized by:   Luretha Murphy NP   Signed by:   Luretha Murphy NP on 07/21/2010   Method used:   [facility]ronically to        [facility] [address] [phone]* (retail)       [address]       [place], [place]  [address]       Ph: [phone] or [phone]       Fax: [phone]   RxID:   [id number]    Orders Added: 1)  [facility]- Est Level  3 [99213]  Appended Document: acute bronchitis     Allergies: No Known Drug Allergies   Complete Medication List: 1)  Lexapro 20 Mg Tabs (Escitalopram oxalate) .Marland Kitchen.. 1 1/2 by mouth daily per [facility] 2)  Wellbutrin Xl 150 Mg Xr24h-tab (Bupropion hcl) .Marland Kitchen.. 1 by mouth per [facility] 3)  Pravastatin Sodium 40 Mg Tabs (Pravastatin sodium) .... Take 1 pill by mouth daily 4)  Prilosec 20 Mg Cpdr (Omeprazole) .Marland Kitchen.. 1 by mouth daily 5)  Zestoretic 20-25 Mg Tabs (Lisinopril-hydrochlorothiazide) .Marland Kitchen.. 1 tab by mouth daily for blood pressure 6)  Norvasc 10 Mg Tabs (Amlodipine besylate) .Marland Kitchen.. 1 by mouth daily at night 7)  Ambien 10 Mg Tabs (Zolpidem tartrate) .Marland Kitchen.. 1 tab by mouth at bedtime as needed insomnia 8)  Doxycycline Hyclate 100 Mg Tabs (Doxycycline hyclate) .... One two times a day for 10 days (there was an error in quantity with previous script) 9)  Prednisone 20 Mg Tabs (Prednisone) .... 3 tabs daily for 5 days 10)  Ventolin Hfa 108 (90 Base) Mcg/act Aers (Albuterol sulfate) .... 2 puffs q 4hours prn  Other Orders: Albuterol Sulfate Sol 1mg  unit dose (J8841)   Medication Administration  Medication # 1:    Medication: Albuterol Sulfate Sol 1mg  unit dose    Diagnosis: BRONCHITIS, ACUTE (ICD-466.0)    Dose: 2.5mg     Route: inhaled    Exp Date: 10/2011    Lot #: [id number]    Mfr: nephron    Comments: 2.5mg /82ml given    Patient tolerated medication without complications    Given by: Tessie Fass CMA (July 21, 2010 2:54 PM)  Orders Added: 1)  Albuterol Sulfate Sol 1mg  unit dose [Z6010]

## 2010-09-07 NOTE — Progress Notes (Signed)
  Phone Note Call from Patient   Caller: Patient Summary of Call: Pt will be getting all his psychiatric meds including ambien from therapist.  Only bp & cholesterol  meds  will be provided by primary.  Please remove ambien and all other meds except noted above from med list. Initial call taken by: Abundio Miu,  August 09, 2010 4:52 PM    New/Updated Medications: AMBIEN 10 MG TABS (ZOLPIDEM TARTRATE) 1 tab qhs Per [facility]

## 2010-09-14 ENCOUNTER — Other Ambulatory Visit: Payer: Self-pay | Admitting: Family Medicine

## 2010-09-14 MED ORDER — PRAVASTATIN SODIUM 40 MG PO TABS: 40.0000 mg | ORAL_TABLET | Freq: Every day | ORAL | Status: DC

## 2010-09-19 ENCOUNTER — Telehealth: Payer: Self-pay | Admitting: Family Medicine

## 2010-09-19 ENCOUNTER — Other Ambulatory Visit: Payer: Self-pay | Admitting: *Deleted

## 2010-09-19 NOTE — Telephone Encounter (Signed)
Pt states that the pharmacy had lost the rx - they have found it and doesn't need it

## 2010-09-20 ENCOUNTER — Other Ambulatory Visit: Payer: Self-pay | Admitting: Family Medicine

## 2010-09-20 MED ORDER — PRAVASTATIN SODIUM 40 MG PO TABS: 40.0000 mg | ORAL_TABLET | Freq: Every day | ORAL | Status: DC

## 2010-09-26 ENCOUNTER — Other Ambulatory Visit: Payer: Self-pay | Admitting: Family Medicine

## 2010-09-26 MED ORDER — PRAVASTATIN SODIUM 40 MG PO TABS: 40.0000 mg | ORAL_TABLET | Freq: Every day | ORAL | Status: DC

## 2010-09-29 ENCOUNTER — Emergency Department (HOSPITAL_COMMUNITY)
Admission: EM | Admit: 2010-09-29 | Discharge: 2010-09-29 | Disposition: A | Discharge status: Home / Self Care | Payer: Self-pay | Attending: Emergency Medicine | Admitting: Emergency Medicine

## 2010-09-29 DIAGNOSIS — F341 Dysthymic disorder: Secondary | ICD-10-CM | POA: Insufficient documentation

## 2010-09-29 DIAGNOSIS — Y929 Unspecified place or not applicable: Secondary | ICD-10-CM | POA: Insufficient documentation

## 2010-09-29 DIAGNOSIS — X500XXA Overexertion from strenuous movement or load, initial encounter: Secondary | ICD-10-CM | POA: Insufficient documentation

## 2010-09-29 DIAGNOSIS — S335XXA Sprain of ligaments of lumbar spine, initial encounter: Secondary | ICD-10-CM | POA: Insufficient documentation

## 2010-09-29 DIAGNOSIS — I10 Essential (primary) hypertension: Secondary | ICD-10-CM | POA: Insufficient documentation

## 2010-09-29 DIAGNOSIS — M545 Low back pain, unspecified: Secondary | ICD-10-CM | POA: Insufficient documentation

## 2010-10-02 ENCOUNTER — Emergency Department (HOSPITAL_COMMUNITY)
Admission: EM | Admit: 2010-10-02 | Discharge: 2010-10-02 | Disposition: A | Discharge status: Home / Self Care | Payer: Self-pay | Attending: Emergency Medicine | Admitting: Emergency Medicine

## 2010-10-02 DIAGNOSIS — F341 Dysthymic disorder: Secondary | ICD-10-CM | POA: Insufficient documentation

## 2010-10-02 DIAGNOSIS — M545 Low back pain, unspecified: Secondary | ICD-10-CM | POA: Insufficient documentation

## 2010-10-02 DIAGNOSIS — Z79899 Other long term (current) drug therapy: Secondary | ICD-10-CM | POA: Insufficient documentation

## 2010-10-02 DIAGNOSIS — I10 Essential (primary) hypertension: Secondary | ICD-10-CM | POA: Insufficient documentation

## 2010-10-02 DIAGNOSIS — Z760 Encounter for issue of repeat prescription: Secondary | ICD-10-CM | POA: Insufficient documentation

## 2010-10-02 DIAGNOSIS — K219 Gastro-esophageal reflux disease without esophagitis: Secondary | ICD-10-CM | POA: Insufficient documentation

## 2010-10-25 ENCOUNTER — Telehealth: Payer: Self-pay | Admitting: Family Medicine

## 2010-10-25 LAB — POCT I-STAT 4, (NA,K, GLUC, HGB,HCT)
HCT: 48 % (ref 39.0–52.0)
Hemoglobin: 16.3 g/dL (ref 13.0–17.0)

## 2010-10-25 NOTE — Telephone Encounter (Signed)
Pt asking for a new MD, says he doesn't trust Dr. Janalyn Harder, for example he says he came in b/c of "cold symptoms" & was told it was just a chest cold & he was fine then pt ended up in the ED a few days later & had to get a breathing machine (per pt), also says he feels like anything he discusses with MD its just dismissed & he never gets more than one option.

## 2010-10-25 NOTE — Telephone Encounter (Signed)
Will route to our Medical Director, Dr. Deirdre Priest.

## 2010-10-31 NOTE — Telephone Encounter (Signed)
He will wait for next reassignment of doctors

## 2010-11-01 ENCOUNTER — Ambulatory Visit (INDEPENDENT_AMBULATORY_CARE_PROVIDER_SITE_OTHER): Payer: Self-pay | Admitting: Family Medicine

## 2010-11-01 ENCOUNTER — Encounter: Payer: Self-pay | Admitting: Family Medicine

## 2010-11-01 VITALS — BP 133/73 | HR 80 | Temp 98.3°F | Wt 250.9 lb

## 2010-11-01 DIAGNOSIS — L301 Dyshidrosis [pompholyx]: Secondary | ICD-10-CM

## 2010-11-01 DIAGNOSIS — M5380 Other specified dorsopathies, site unspecified: Secondary | ICD-10-CM

## 2010-11-01 DIAGNOSIS — M999 Biomechanical lesion, unspecified: Secondary | ICD-10-CM | POA: Insufficient documentation

## 2010-11-01 MED ORDER — TRIAMCINOLONE ACETONIDE 0.1 % EX CREA: TOPICAL_CREAM | Freq: Two times a day (BID) | CUTANEOUS | Status: DC

## 2010-11-01 MED ORDER — CYCLOBENZAPRINE HCL 5 MG PO TABS: 5.0000 mg | ORAL_TABLET | Freq: Three times a day (TID) | ORAL | Status: AC | PRN

## 2010-11-01 MED ORDER — HYDROCODONE-ACETAMINOPHEN 5-500 MG PO TABS: 1.0000 | ORAL_TABLET | Freq: Every day | ORAL | Status: DC
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
## 2010-11-01 NOTE — Assessment & Plan Note (Signed)
After verbal consent given HVLA on side gave marked improvement with pelvic and thoracic pathology. Pt to continue muscle relaxant and given vicodin for the short term, to retun if need another treatment.

## 2010-11-09 LAB — URINALYSIS, ROUTINE W REFLEX MICROSCOPIC
Glucose, UA: NEGATIVE mg/dL
Hgb urine dipstick: NEGATIVE
Protein, ur: NEGATIVE mg/dL
pH: 6.5 (ref 5.0–8.0)

## 2010-11-13 ENCOUNTER — Telehealth: Payer: Self-pay | Admitting: Family Medicine

## 2010-11-13 NOTE — Telephone Encounter (Signed)
Pt called wanting to schedule an appt with Dr. Katrinka Blazing for a f/u visit.  Informed him that Dr. Katrinka Blazing was not available the remainder of this month.  Asked him about scheduling with Dr. Janalyn Harder and he said he was told by you that he did not have to see Dr. Janalyn Harder and would be reassigned to someone else.  Pt was told that this would have to be verified by you before making an appt with someone else if his provider is available. Also informed him of last correspondence from you regarding reassignment and he insisted that he had spoken with your after that and the situation was different from what's stated in chart.  Please let me know what the story is on this patient.

## 2010-11-14 NOTE — Telephone Encounter (Signed)
I discussed with him and we decided we would wait to reassignment time to get him a new PCP since Dr Janalyn Harder was graduating.  If he wants to be seen before then and not with Dr Janalyn Harder he can be seen by anyone on that team at a time that suits him Thanks Corky Blumstein L

## 2010-11-17 ENCOUNTER — Ambulatory Visit (INDEPENDENT_AMBULATORY_CARE_PROVIDER_SITE_OTHER): Payer: Self-pay | Admitting: Family Medicine

## 2010-11-17 ENCOUNTER — Encounter: Payer: Self-pay | Admitting: Family Medicine

## 2010-11-17 VITALS — BP 136/88 | HR 86 | Temp 96.9°F | Ht 72.0 in | Wt 249.0 lb

## 2010-11-17 DIAGNOSIS — M5380 Other specified dorsopathies, site unspecified: Secondary | ICD-10-CM

## 2010-11-17 DIAGNOSIS — M999 Biomechanical lesion, unspecified: Secondary | ICD-10-CM

## 2010-11-17 MED ORDER — MELOXICAM 15 MG PO TABS: 15.0000 mg | ORAL_TABLET | Freq: Every day | ORAL | Status: DC

## 2010-11-17 MED ORDER — HYDROCODONE-ACETAMINOPHEN 5-500 MG PO TABS: 1.0000 | ORAL_TABLET | Freq: Every day | ORAL | Status: DC

## 2010-11-17 NOTE — Patient Instructions (Addendum)
I will give you one weeks worth of vicodin to help with the pain at night.  I would like for you to get the meloxicam filled and take this daily.  Do not take ibuprofen or aleve with this.  I would like for you to do the stretches in the handout.  I will get you set up with physical therapy as I feel this will help the most with your pain.  Hopefully the adjustment today helps with your pain some.  I would like for you to come back in two weeks to see how you are doing.

## 2010-11-20 ENCOUNTER — Ambulatory Visit: Payer: Self-pay | Admitting: Family Medicine

## 2010-11-21 NOTE — Progress Notes (Signed)
  Subjective:    Patient ID: Timothy Lowe, male    DOB: 1966-07-03, 45 y.o.   MRN: [id number]  HPI Returns with continued low back pain.  Had spinal manipulation done by Dr. Katrinka Blazing at last visit that helped for a few days, then pain returned.  When pain returned it was worse than previous.   States he can not lie down flat.  Only position he is comfortable in is sitting in his lazy boy.  Interested in having manipulation done again since it did help briefly.  Given vicodin at last visit to help with pain at night, states these helped.  Denies bowel or bladder dysfunction, n/v, chest pain, shortness of breath, diarrhea, dysuria, hematuria.       Review of Systems See HPI    Objective:   Physical Exam  Constitutional: He appears well-developed and well-nourished. No distress.  HENT:  Head: Normocephalic and atraumatic.  Cardiovascular: Normal rate, regular rhythm and normal heart sounds.   Pulmonary/Chest: Effort normal and breath sounds normal.  Musculoskeletal:       Lumbar back: He exhibits tenderness.       Tenderness and spasm along lower paraspinal musculature.  No bony tenderness.  Pain reproduced with SLR, negative for radicular signs..  ROM testing with decreased ROM with forward flexion.    Osteopathic Exam Thoracic T5 rotated and side bent right  Lumbar: L2 rotated and side bent right            Assessment & Plan:

## 2010-11-21 NOTE — Assessment & Plan Note (Addendum)
Manipulation performed, started on meloxicam.  Given instructions for back exercises, will refer to physical therapy for formal treatment to help with core strengthening and additional strectching.  Given a few additional days of vicodin while getting started with back exercises and relief at night

## 2010-11-21 NOTE — Assessment & Plan Note (Signed)
After verbal consent given HVLA on affected side gave >50% improvement in pain.  Recheck of throacic and lumbar spine showed improvement of pathology.

## 2010-12-07 ENCOUNTER — Other Ambulatory Visit: Payer: Self-pay | Admitting: Family Medicine

## 2010-12-08 NOTE — Telephone Encounter (Signed)
Refill request

## 2010-12-13 ENCOUNTER — Inpatient Hospital Stay (INDEPENDENT_AMBULATORY_CARE_PROVIDER_SITE_OTHER)
Admission: RE | Admit: 2010-12-13 | Discharge: 2010-12-13 | Disposition: A | Discharge status: Home / Self Care | Payer: Self-pay | Source: Ambulatory Visit | Attending: Family Medicine | Admitting: Family Medicine

## 2010-12-13 DIAGNOSIS — R05 Cough: Secondary | ICD-10-CM

## 2010-12-19 ENCOUNTER — Emergency Department (HOSPITAL_COMMUNITY): Payer: Self-pay

## 2010-12-19 ENCOUNTER — Emergency Department (HOSPITAL_COMMUNITY)
Admission: EM | Admit: 2010-12-19 | Discharge: 2010-12-19 | Disposition: A | Discharge status: Home / Self Care | Payer: Self-pay | Attending: Emergency Medicine | Admitting: Emergency Medicine

## 2010-12-19 DIAGNOSIS — R059 Cough, unspecified: Secondary | ICD-10-CM | POA: Insufficient documentation

## 2010-12-19 DIAGNOSIS — F411 Generalized anxiety disorder: Secondary | ICD-10-CM | POA: Insufficient documentation

## 2010-12-19 DIAGNOSIS — Z79899 Other long term (current) drug therapy: Secondary | ICD-10-CM | POA: Insufficient documentation

## 2010-12-19 DIAGNOSIS — M549 Dorsalgia, unspecified: Secondary | ICD-10-CM | POA: Insufficient documentation

## 2010-12-19 DIAGNOSIS — K219 Gastro-esophageal reflux disease without esophagitis: Secondary | ICD-10-CM | POA: Insufficient documentation

## 2010-12-19 DIAGNOSIS — R05 Cough: Secondary | ICD-10-CM | POA: Insufficient documentation

## 2010-12-19 DIAGNOSIS — F3289 Other specified depressive episodes: Secondary | ICD-10-CM | POA: Insufficient documentation

## 2010-12-19 DIAGNOSIS — I10 Essential (primary) hypertension: Secondary | ICD-10-CM | POA: Insufficient documentation

## 2010-12-19 DIAGNOSIS — F329 Major depressive disorder, single episode, unspecified: Secondary | ICD-10-CM | POA: Insufficient documentation

## 2010-12-19 NOTE — Op Note (Signed)
NAME:  REHAAN, VILORIA             ACCOUNT NO.:  [id number]   MEDICAL RECORD NO.:  [id number]          PATIENT TYPE:  AMB   LOCATION:  SDS                          FACILITY:  [facility]   PHYSICIAN:  Artist Pais. Weingold, M.D.DATE OF BIRTH:  Aug 21, 1965   DATE OF PROCEDURE:  DATE OF DISCHARGE:  03/27/2007                               OPERATIVE REPORT   PREOPERATIVE DIAGNOSIS:  Crush injury with paronychia left index finger.   POSTOPERATIVE DIAGNOSIS:  Crush injury with paronychia left index  finger.   PROCEDURE:  Incision and drainage left index finger.   SURGEON:  Dairl Ponder, MD   ASSISTANT:  None.   ANESTHESIA:  General.   TOURNIQUET TIME:  11 minutes.   COMPLICATIONS:  None.   DRAINS:  None.   CULTURES:  Cultures x2 were sent.   OPERATIVE REPORT:  Patient taken to the operating suite after the  induction of adequate general anesthesia.  The left upper extremity was  prepped and draped in sterile fashion.  An Esmarch was used to  exsanguinate the limb.  The tourniquet was then inflated to 250 mmHg.  At this point in time, a Therapist, nutritional was used to elevate the  eponychial fold off the nail plate.  This was cultured, irrigated  thoroughly with normal saline, packed with 1x8 Xeroform wrap.  He was  then dressed with 4x4's and covered and wrapped.  The patient tolerated  the procedure well.      Artist Pais Mina Marble, M.D.  Electronically Signed     MAW/MEDQ  D:  03/27/2007  T:  03/28/2007  Job:  [id number]
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
## 2010-12-28 ENCOUNTER — Ambulatory Visit: Payer: Self-pay | Admitting: Family Medicine

## 2010-12-28 ENCOUNTER — Encounter: Payer: Self-pay | Admitting: Family Medicine

## 2010-12-28 ENCOUNTER — Telehealth: Payer: Self-pay | Admitting: Family Medicine

## 2010-12-28 ENCOUNTER — Ambulatory Visit (INDEPENDENT_AMBULATORY_CARE_PROVIDER_SITE_OTHER): Payer: Self-pay | Admitting: Family Medicine

## 2010-12-28 VITALS — BP 134/84 | HR 90 | Temp 98.0°F | Ht 72.0 in | Wt 250.0 lb

## 2010-12-28 DIAGNOSIS — I10 Essential (primary) hypertension: Secondary | ICD-10-CM

## 2010-12-28 DIAGNOSIS — R05 Cough: Secondary | ICD-10-CM

## 2010-12-28 DIAGNOSIS — K219 Gastro-esophageal reflux disease without esophagitis: Secondary | ICD-10-CM

## 2010-12-28 DIAGNOSIS — R053 Chronic cough: Secondary | ICD-10-CM | POA: Insufficient documentation

## 2010-12-28 MED ORDER — BENZONATATE 200 MG PO CAPS: 200.0000 mg | ORAL_CAPSULE | Freq: Three times a day (TID) | ORAL | Status: AC | PRN

## 2010-12-28 MED ORDER — OMEPRAZOLE 20 MG PO CPDR: 40.0000 mg | DELAYED_RELEASE_CAPSULE | Freq: Every day | ORAL | Status: DC

## 2010-12-28 NOTE — Assessment & Plan Note (Signed)
Will increase omeprazole to 40mg  daily as possible cause of chronic cough and throat irritation. Counseled on dietary triggers to avoid. May consider increasing dose again if symptoms persist.

## 2010-12-28 NOTE — Telephone Encounter (Signed)
Mr. Timothy Lowe was seen today for persistent cough.  Was seen by Dr. Cristal Ford who prescribed him Tessalon capsules.   Patient called back from pharmacy and said that the prescription he was given was not for the same medicine he had before, that he had been given Tussinex  and wanted Dr. Cristal Ford to send that rx to the pharmacy.   Researched past visits and saw that he had been given Tussinex with codeine.  Asked Timothy Lowe to hang up and let me get back with him after speaking with Dr. Cristal Ford.  Dr. Cristal Ford stated she was aware f that she had given him the medicine prescribed and would not change to the Tussinex.  Spoke with Dr. Jennette Kettle and Dr. Deirdre Priest regarding this and was confirmed by both that patient was not to be prescribed cough med with narcotic.  Informed patient of this and reminded that under the Pain Contract we would not be able to give him the other medicine.  Patient became furious and said that he not longer had a PainContract, that he had been given pain meds since then and that You  (fowl language) people are a piece of work and hung up.     Timothy Lowe called back and spoke with Timothy Lowe asking to speak with Dr. Deirdre Priest.  Wanted to question why the Pain Contract was in effect.

## 2010-12-28 NOTE — Assessment & Plan Note (Signed)
Controlled on single agent currently. Will consider restarting HCTZ if above goal at follow up visit. Will not restart ACEi due to possible involvement of cough.

## 2010-12-28 NOTE — Patient Instructions (Addendum)
Nice to meet you. Your cough may be from GERD. Try taking prilosec 40mg  daily and avoiding triggers for reflux. Your cough will most likely not go away immediately. Follow up appointment with Dr. Janalyn Harder in next 1-2 weeks to discuss blood pressure or if cough is not improving.  Diet for GERD or PUD Nutrition therapy can help ease the discomfort of gastroesophageal reflux disease (GERD) and peptic ulcer disease (PUD).  HOME CARE INSTRUCTIONS  Eat your meals slowly, in a relaxed setting.   Eat 5 to 6 small meals per day.   If a food causes distress, stop eating it for a period of time.  FOODS TO AVOID:  Coffee, regular or decaffeinated.  Cola beverages, regular or low calorie.   Tea, regular or decaffeinated.   Pepper.   Cocoa.   High fat foods including meats.   Butter, margarine, hydrogenated oil (trans fats).  Peppermint or spearmint (if you have GERD).   Fruits and vegetables as tolerated.   Alcoholic beverages.   Nicotine (smoking or chewing). This is one of the most potent stimulants to acid production in the gastrointestinal tract.   Any food that seems to aggravate your condition.   If you have questions regarding your diet, call your caregiver's office or a registered dietitian. OTHER TIPS IF YOU HAVE GERD:  Lying flat may make symptoms worse. Keep the head of your bed raised 6 to 9 inches by using a foam wedge or blocks under the legs of the bed.   Do not lay down until 3 hours after eating a meal.   Daily physical activity may help reduce symptoms.  MAKE SURE YOU:   Understand these instructions.   Will watch your condition.   Will get help right away if you are not doing well or get worse.  Document Released: 07/23/2005 Document Re-Released: 12/09/2008 [facility] Patient Information 2011 [place], [place].

## 2010-12-28 NOTE — Assessment & Plan Note (Signed)
Present >72yr. GERD vs ACEi-induced vs post nasal drip vs reactive airway. Will continue discontinuation of ACEi currently as symptoms should resolve within 2 weeks of discontinuation. Will increase PPI today as symptoms seem related to meals and lying down. Defer repeating chest xray as multiple films have been negative over past year including recently on 5/15. Much less likely adult onset asthma as patient never smoked. Tessalon for symptoms. F/u in 1-2 weeks.

## 2010-12-28 NOTE — Progress Notes (Signed)
  Subjective:    Patient ID: Timothy Lowe, male    DOB: November 21, 1965, 45 y.o.   MRN: [id number]  HPI 1. Chronic cough. Most recent exacerbation for several days, but present chronically over past year. Seen multiple times in clinic, urgent care and most recently in ED on 12/19/10. Chest xrays negative, prescribed albuterol and his ACEi was discontinued at that time. Better with cough syrup tussionex for several days, but now returns and worse. Albuterol does not help. Short course of prednisone did not help. Has throat irritation. Cough worse with lying down and after big meals. Has belching and acid indigestion regularly. Symptoms are better earlier in the day and worsen after lunch and dinner and at night time. No history of asthma or smoking.   2. HTN. Lisinopril-HCTZ discontinued at ED 9 days ago for possible relation to chronic cough. Cough has not improved yet.    Review of Systems Denies fevers, chest pain, dyspnea, headaches. See HPI. Endorses belching and indigestion.    Objective:   Physical Exam  Vitals reviewed. Constitutional: He is oriented to person, place, and time. He appears well-developed and well-nourished. No distress.  HENT:  Head: Normocephalic and atraumatic.  Mouth/Throat: No oropharyngeal exudate.       Erythematous and injected oropharynx.   Neck: Neck supple.  Cardiovascular: Normal rate, regular rhythm, normal heart sounds and intact distal pulses.   No murmur heard. Pulmonary/Chest: Effort normal and breath sounds normal. No respiratory distress. He has no wheezes. He has no rales. He exhibits no tenderness.  Musculoskeletal: He exhibits no edema and no tenderness.  Lymphadenopathy:    He has no cervical adenopathy.  Neurological: He is alert and oriented to person, place, and time.  Psychiatric: He has a normal mood and affect.          Assessment & Plan:

## 2010-12-29 ENCOUNTER — Telehealth: Payer: Self-pay | Admitting: Family Medicine

## 2010-12-29 NOTE — Telephone Encounter (Signed)
Spoke with Mr Delaguila and reviewed his recent interactions.   I explained that I had discussed and agreed with Dr Sherran Needs recommendations regarding his chronic cough and not to prescribe tussionex.   I suggested that if his cough persists he might need to be referred to pulmonology.  I informed him that we expected our patients to speak to everyone in this practice in a polite manner and that we would not tolerate profanity.   He said he had not used any profanity with Ms Charlean Sanfilippo and that he was only rude when people were rude with him.  I asked him if he had called Dr Janalyn Harder a terrible doctor and he said that he might have.    He related that he had insurance now and that he was planning to seek care elsewhere.  I responded that if he had lost that much faith in our practice that was a good plan.

## 2010-12-29 NOTE — Telephone Encounter (Signed)
Calling again, wants to speak with someone asap, says his cough is really bad and need something to suppress it.

## 2010-12-29 NOTE — Telephone Encounter (Signed)
According to Dr Deirdre Priest, [name] is NOT to receive any narcotics from [facility].  His narcotics contract that he signed originally with Dr Denyse Amass IS always in effect.

## 2010-12-29 NOTE — Telephone Encounter (Signed)
Will route to Dr. Janalyn Harder to clarify the status of his pain contract.

## 2010-12-29 NOTE — Telephone Encounter (Signed)
Message copied by Judi Saa on Fri Dec 29, 2010  8:42 AM ------      Message from: CHAMBLISS, LEE L      Created: Thu Dec 28, 2010  4:36 PM      Regarding: Medication Caution added to his chart       Mr Q is not to receive any controlled substances from the [facility]      Thanks      LC

## 2010-12-29 NOTE — Telephone Encounter (Signed)
Called pt back.  Discussed that I agree with Dr Cristal Ford that pt should get [facility] for cough.  Discussed that we will not be prescribing any narcotics or benzo for the patient.    Pt states that "You will be leaving soon so I will not have to deal with you any longer.  I hope you find another area to go to because you are a terrible doctor. I want to talk to Dr Deirdre Priest and I do not want you to call me anymore.  You are not my doctor."

## 2011-01-02 NOTE — Telephone Encounter (Signed)
error 

## 2011-01-02 NOTE — Telephone Encounter (Signed)
[facility] Pt Review Committee met and discussed recent events  Recommendation Will send a patient letter.  Will offer anger management options.  If rude behavior recurs will dismiss from our practice (See letter for details)

## 2011-01-03 NOTE — Telephone Encounter (Signed)
The letter that was done for this pt is showing incomplete therefore cannot be sent.

## 2011-01-03 NOTE — Telephone Encounter (Signed)
Letter printed and mailed.  

## 2011-01-09 ENCOUNTER — Encounter: Payer: Self-pay | Admitting: Family Medicine

## 2011-01-09 ENCOUNTER — Telehealth: Payer: Self-pay | Admitting: Family Medicine

## 2011-01-09 NOTE — Telephone Encounter (Signed)
Patient has called the office twice since I spoke with him at 2 pm.  I called him back now and he was wondering what Dr. Janalyn Harder and Deirdre Priest' response was.  I explained to him that it had only been 1 hour ago since we had talked and I did not know if they had even read the note yet.  I explained that even if the order for the referral was written now if would still take [place] at least 24 hours to process it.  He replied "I understand that but my question is what am I supposed to do in the interim?  Taking these Tessalon Pearls is like eating candy.  What am I supposed to do, go to the [facility] with this cough?"  I told him that if he felt that he needed to be seen urgently then yes, he should go to an [facility].  His final question was "so I take it that they are not willing to prescribe me anything?"  I told him no, not without being seen.  Patient then said "I understand" then promptly hung up.

## 2011-01-09 NOTE — Telephone Encounter (Signed)
Pt called back to speak with you again

## 2011-01-09 NOTE — Telephone Encounter (Signed)
Patient says that the cough is worse in am and at hs - keeping him awake at night.  He says that the idea of seeing a pulmonologist is a good idea but was wondering why he would need to come back to see [place] for the referral.  Was wondering if he could go ahead be referred so his cough can be address as soon as possible.  Told him I would route bote to both Dr. Janalyn Harder and Dr. Deirdre Priest since Dr. Deirdre Priest was aware of the situation.  Patient appreciative.

## 2011-01-09 NOTE — Telephone Encounter (Signed)
This encounter was created in error - please disregard.

## 2011-01-09 NOTE — Telephone Encounter (Signed)
I think that it would be beneficial for him to be seen in Pulmonary Clinic with Dr Raymondo Band so that we can get PFT.  He can also bring his inhaler to the appt so that Dr Raymondo Band and his residents can work with him to make sure that he is using the inhaler correctly.  It would be beneficial to have the result of that test before we refer him to [facility].  Pt needs to understand that it may take months for dry cough to resolve.

## 2011-01-09 NOTE — Telephone Encounter (Signed)
I will be happy to see pt in clinic. Review of record showed that CXR from [phone] showed no cardiopulmonary process.  Dr Deirdre Priest has also advised pt that if cough continues we may refer him to [facility]. Pt should be seen.

## 2011-01-09 NOTE — Telephone Encounter (Signed)
I do not have any additional advice for him.  Will route this to Dr. Janalyn Harder.

## 2011-01-09 NOTE — Telephone Encounter (Signed)
Pt has been fighting this cough for a long time and it has become difficult to continue with this.  He understands that he will not be given any cough meds with codeine but wants to know what else he can do.  He is at our mercy and needs our advise as to what to do now.  He was here a couple of weeks ago for this and since he has a new job in [place] it is difficult for him to come in again.  Please advise.

## 2011-01-10 ENCOUNTER — Telehealth: Payer: Self-pay | Admitting: Family Medicine

## 2011-01-10 ENCOUNTER — Emergency Department (HOSPITAL_BASED_OUTPATIENT_CLINIC_OR_DEPARTMENT_OTHER)
Admission: EM | Admit: 2011-01-10 | Discharge: 2011-01-10 | Disposition: A | Discharge status: Home / Self Care | Payer: Self-pay | Attending: Emergency Medicine | Admitting: Emergency Medicine

## 2011-01-10 ENCOUNTER — Other Ambulatory Visit: Payer: Self-pay | Admitting: Family Medicine

## 2011-01-10 DIAGNOSIS — F341 Dysthymic disorder: Secondary | ICD-10-CM | POA: Insufficient documentation

## 2011-01-10 DIAGNOSIS — K219 Gastro-esophageal reflux disease without esophagitis: Secondary | ICD-10-CM | POA: Insufficient documentation

## 2011-01-10 DIAGNOSIS — R059 Cough, unspecified: Secondary | ICD-10-CM | POA: Insufficient documentation

## 2011-01-10 DIAGNOSIS — Z79899 Other long term (current) drug therapy: Secondary | ICD-10-CM | POA: Insufficient documentation

## 2011-01-10 DIAGNOSIS — R05 Cough: Secondary | ICD-10-CM | POA: Insufficient documentation

## 2011-01-10 DIAGNOSIS — I10 Essential (primary) hypertension: Secondary | ICD-10-CM | POA: Insufficient documentation

## 2011-01-10 DIAGNOSIS — G8929 Other chronic pain: Secondary | ICD-10-CM | POA: Insufficient documentation

## 2011-01-10 NOTE — Telephone Encounter (Signed)
Please let pt know that as his PCP, I would like to examine him first.  There may be many reasons for his cough, including reflux, chronic upper airway etiology, sinusitis.  He should be evaluated again and other medications should be tried before it would be appropriate for referral to pulmonology.

## 2011-01-10 NOTE — Telephone Encounter (Signed)
Mr. Oddo again calling regarding referral.  Per Dennison Nancy msg to be routed to provider for documentation

## 2011-01-10 NOTE — Telephone Encounter (Signed)
Mr. Sundt called to speak with you or Angelique Blonder.  Angelique Blonder refused so I'm sending this to you.  Would not give me info why the call.

## 2011-01-11 ENCOUNTER — Telehealth: Payer: Self-pay | Admitting: Emergency Medicine

## 2011-01-11 MED ORDER — HYDROCOD POLST-CHLORPHEN POLST 10-8 MG/5ML PO LQCR: ORAL | Status: DC

## 2011-01-11 NOTE — Telephone Encounter (Signed)
Please let pt know that he can be seen today (Thurs) as a Work In with Dr Alvester Morin. Thank you.

## 2011-01-11 NOTE — Telephone Encounter (Signed)
LMOM TCB x1 - RB is not in office again until 6.13.12.  Will patient be okay to wait for recs until then?  Since unsure of symptoms, need to know if he can wait or need to send msg to doc of the day.

## 2011-01-11 NOTE — Telephone Encounter (Signed)
Called and spoke with pt. Pt aware rx sent to pharmacy and to keep appt with RB

## 2011-01-11 NOTE — Telephone Encounter (Signed)
Dr. Janalyn Harder, you do not have any openings at all, would you like him worked in with someone else?

## 2011-01-11 NOTE — Telephone Encounter (Signed)
Per CY-okay to give Tussionex #156ml 1 tsp every 12 hours prn no refills and KEEP APPT with RB.

## 2011-01-11 NOTE — Telephone Encounter (Signed)
fyi

## 2011-01-11 NOTE — Telephone Encounter (Signed)
LMOMTCB to inform pt [facility] already [facility] but he MUST KEEP appt on 6/26 with RB.

## 2011-01-11 NOTE — Telephone Encounter (Signed)
Pt returning call.Timothy Lowe ° °

## 2011-01-11 NOTE — Telephone Encounter (Signed)
Called spoke with patient who c/o persistant cough x69months who has an upcoming consult with RB on 6.26.12.  Pt states his lisinopril has been dc'd, he's been taking his protonix as prescribed and the tessalon perles do not help.  Pt feels the only medication that really calms his cough is tussionex but was only given a 3 day supply of this from the ED on 6.6.12.  Pt is requesting a rx of this to last until his appt with RB.  Pt is aware that RB is not in the office until 6.13.12 but instead of waiting that long, pt requests doc on call be asked to write this rx.    Will send request to [facility]  NKDA.

## 2011-01-11 NOTE — Telephone Encounter (Signed)
Called pt to schedule appt, pts response, "You know, because I'm a nice guy I'm not gonna tell you what I want you to tell Dr. Janalyn Harder, because it wouldn't be very nice, You tell Dr. Janalyn Harder I no longer need her services.

## 2011-01-11 NOTE — Telephone Encounter (Signed)
FYI

## 2011-01-18 ENCOUNTER — Telehealth: Payer: Self-pay | Admitting: Internal Medicine

## 2011-01-18 ENCOUNTER — Ambulatory Visit (INDEPENDENT_AMBULATORY_CARE_PROVIDER_SITE_OTHER): Payer: 59 | Admitting: Internal Medicine

## 2011-01-18 ENCOUNTER — Encounter: Payer: Self-pay | Admitting: Internal Medicine

## 2011-01-18 VITALS — BP 125/92 | HR 72 | Temp 98.2°F | Ht 72.0 in | Wt 250.6 lb

## 2011-01-18 DIAGNOSIS — R05 Cough: Secondary | ICD-10-CM

## 2011-01-18 MED ORDER — HYDROCOD POLST-CHLORPHEN POLST 10-8 MG/5ML PO LQCR: ORAL | Status: DC

## 2011-01-18 NOTE — Patient Instructions (Signed)
Please use netti pot saline wash atleast 4 times a week  - nurse will show you picture Please use diet sheet diligently for acid reflux control Continue medications for acid reflux Have methacholine challenge test for asthma Return next week with results of methacholine challenge to discuss further steps in cough control

## 2011-01-18 NOTE — Progress Notes (Signed)
Subjective:    Patient ID: Timothy Lowe, male    DOB: 14-Feb-1966, 45 y.o.   MRN: [id number]  Cough This is a chronic (45 year old male. BMI 34. PMD - Dr Marijo Conception TA. Self refrerred by mom for cough. NEw visit to pulmonary for chronic cough) problem. The current episode started more than 1 month ago (started 6 months ago. Insidious onset. sTarted as 'slow annoying hack" and becaome progressively worse. Intiially Rx with OTC "stuff". One months after it started he got concerned and took medical attention). The problem has been gradually worsening. The problem occurs constantly. The cough is non-productive (Very severe. Coughs day and night. ). Associated symptoms include heartburn and postnasal drip. Pertinent negatives include no chest pain, chills, ear congestion, ear pain, eye redness, fever, headaches, hemoptysis, myalgias, nasal congestion, rash, rhinorrhea, sore throat, shortness of breath, sweats, weight loss or wheezing. Associated symptoms comments: Coughing has resulted in otalgia and neck pain and feels like it is choking/sore throat.Feels a knot in right internal carotid area Very concerned about severity of cough. He is frustrated by persistence and severity and degree of social embarassment. Has Post nasal drip on and off +. On and off tickle in throat +. Does admit to spring time allergies +. Admits to Pmhx of GERD x 6 years - on PPI for 6 years. 1 month ago prilosec dose doubled up without relief. Diet: drinks 3 sodas a day, fried food +, . 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 Talking in job makes throat sore and choked up and makes cough worse  Has slept for months in recliner. No asthma trigger factors). Risk factors: No risk factors for chronic lung disease. He has tried rest, oral steroids, OTC inhaler and a beta-agonist inhaler (stopped lisinopril for hypertension 1 year ago but not helped, Multiple ER  visits for cough - no relief. Doubled up  on prrilosec 1 months ago but still no relief. Prednisone relieves it only for time he is on prrendisone (has taken 5 courses of prednisone) for the symptoms. The treatment provided no relief. His past medical history is significant for environmental allergies. There is no history of asthma, bronchiectasis, bronchitis, COPD, emphysema or pneumonia. 11/15/2008 - CT abdomen lung cut - shows no fibrosis, CXR 12/19/2010  looks normal to me   NOTE : HE IS HIGHLY anxious and fixated about his cough. He wants quick fix. Initially he told me tussionex did not work for him. I had counseled him about patient, step by step approach needed for cough. He agreed and just before discharge from office.he called me back in room started sobbing. Wanted tussionex to temporize cough and said he risked losing job due to cough and demanded 1 bottle of tussionex.     Review of Systems  Constitutional: Negative for fever, chills, weight loss and unexpected weight change.  HENT: Positive for congestion and postnasal drip. Negative for ear pain, nosebleeds, sore throat, rhinorrhea, sneezing, trouble swallowing, dental problem and sinus pressure.   Eyes: Negative for redness and itching.  Respiratory: Positive for cough. Negative for hemoptysis, chest tightness, shortness of breath and wheezing.   Cardiovascular: Negative for chest pain, palpitations and leg swelling.  Gastrointestinal: Positive for heartburn. Negative for nausea and vomiting.  Genitourinary: Negative for dysuria.  Musculoskeletal: Negative for myalgias and joint swelling.  Skin: Negative for rash.  Neurological: Negative for headaches.  Hematological:  Positive for environmental allergies. Does not bruise/bleed easily.  Psychiatric/Behavioral: Negative for dysphoric mood. The patient is not nervous/anxious.        Objective:   Physical Exam  Nursing note and vitals reviewed. Constitutional: He is oriented to  person, place, and time. He appears well-developed and well-nourished. No distress.       Obese Coughing a lot  HENT:  Head: Normocephalic and atraumatic.  Right Ear: External ear normal.  Left Ear: External ear normal.  Mouth/Throat: Oropharynx is clear and moist. No oropharyngeal exudate.  Eyes: Conjunctivae and EOM are normal. Pupils are equal, round, and reactive to light. Right eye exhibits no discharge. Left eye exhibits no discharge. No scleral icterus.  Neck: Normal range of motion. Neck supple. No JVD present. No tracheal deviation present. No thyromegaly present.       Some carious teeth mallampatti class 2 No thrush No neck nodes  Cardiovascular: Normal rate, regular rhythm and intact distal pulses.  Exam reveals no gallop and no friction rub.   No murmur heard. Pulmonary/Chest: Effort normal and breath sounds normal. No respiratory distress. He has no wheezes. He has no rales. He exhibits no tenderness.  Abdominal: Soft. Bowel sounds are normal. He exhibits no distension and no mass. There is no tenderness. There is no rebound and no guarding.  Musculoskeletal: Normal range of motion. He exhibits no edema and no tenderness.  Lymphadenopathy:    He has no cervical adenopathy.  Neurological: He is alert and oriented to person, place, and time. He has normal reflexes. No cranial nerve deficit. Coordination normal.  Skin: Skin is warm and dry. No rash noted. He is not diaphoretic. No erythema. No pallor.  Psychiatric: He has a normal mood and affect. His behavior is normal. Judgment and thought content normal.       Mildly anxious          Assessment & Plan:

## 2011-01-18 NOTE — Telephone Encounter (Signed)
Spoke to pt he will need to reschedule methacholine challenge test pt was given the # to call and change the appt to suit his schedule

## 2011-01-21 ENCOUNTER — Encounter: Payer: Self-pay | Admitting: Internal Medicine

## 2011-01-21 ENCOUNTER — Telehealth: Payer: Self-pay | Admitting: Family Medicine

## 2011-01-21 NOTE — Assessment & Plan Note (Signed)
Cough persistent despite dc of aCe inhibitor. Sounds like LPR type cough made worse by sinus drainage and poor GERD diet (despite PPIs). There is likely element of cyclical cough too.   PLAN netti pot saline wash Strict gerd diet tussionex refill x 1 done (at his pleading and insistence) Methacholine challenge test ROV with results of methacholine challenge test - if negtaive, initiate cyclical cough protocol along with neurontin   NOTE: He is reluctant to try step by step time based approach. He seems to reject idea of 3 days of voice rest (due to risk of losing new job that is actually making cough worse). AT followup if he is unwilling or uanable to implement Rx plan for cough, I will consider referral to Dr. Sherene Sires or outside

## 2011-01-21 NOTE — Telephone Encounter (Signed)
Error

## 2011-01-23 ENCOUNTER — Telehealth: Payer: Self-pay | Admitting: Internal Medicine

## 2011-01-23 ENCOUNTER — Encounter (HOSPITAL_COMMUNITY): Payer: 59

## 2011-01-23 NOTE — Telephone Encounter (Signed)
There is a specific one time role for tussionex in cough. And without him doing methacholine challenge test and coming back for followup I cannot give any more tussionex. I made a one time exception for him. I will not and cannot make more exceptions unless he finishes methacholine and returns for followup and even then it depends on clinical course. IF he is unable to follow the specific plan I laid out I am happy to refer him to  Dr. Sherene Sires who is the best cough expert in [place] or [place]. I am sorry I am unable to help him beyond that  Please tell him that.

## 2011-01-23 NOTE — Telephone Encounter (Signed)
I spoke with the patient and he states in order to keep from coughing during the day while at work he has been taking the tussionex more then prescribed. 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 Pt has methacoline challenge set 01-26-11. Please advise.Carron Curie, CMA

## 2011-01-23 NOTE — Telephone Encounter (Signed)
I spoke with the patient and advised of MR recs. Pt states he will have methacoline and wait for results.  Carron Curie, CMA

## 2011-01-24 ENCOUNTER — Other Ambulatory Visit: Payer: Self-pay | Admitting: Family Medicine

## 2011-01-24 MED ORDER — AMLODIPINE BESYLATE 10 MG PO TABS: ORAL_TABLET | ORAL | Status: DC

## 2011-01-26 ENCOUNTER — Ambulatory Visit (HOSPITAL_COMMUNITY)
Admission: RE | Admit: 2011-01-26 | Discharge: 2011-01-26 | Disposition: A | Discharge status: Home / Self Care | Payer: 59 | Source: Ambulatory Visit | Attending: Internal Medicine | Admitting: Internal Medicine

## 2011-01-26 ENCOUNTER — Other Ambulatory Visit: Payer: Self-pay | Admitting: Family Medicine

## 2011-01-26 ENCOUNTER — Telehealth: Payer: Self-pay | Admitting: Internal Medicine

## 2011-01-26 DIAGNOSIS — R05 Cough: Secondary | ICD-10-CM | POA: Insufficient documentation

## 2011-01-26 DIAGNOSIS — R059 Cough, unspecified: Secondary | ICD-10-CM | POA: Insufficient documentation

## 2011-01-26 MED ORDER — AMLODIPINE BESYLATE 10 MG PO TABS: ORAL_TABLET | ORAL | Status: DC

## 2011-01-26 NOTE — Telephone Encounter (Signed)
[id number].Timothy Lowe, CMA  

## 2011-01-26 NOTE — Telephone Encounter (Signed)
His methacholine challenge test today 01/26/2011 is POSITIVE FOR ASthma - PD20 is between 1 and 4 units. Please give him results. HAve him see Tammy P next week for review and discussion and starting symbicort 80/4.5  Or, you can send script for symbicort (same dose) followed by followup in 3-4 weeks with Tammy. NO TUSSIONEX please. He should still follow other instructions

## 2011-01-26 NOTE — Telephone Encounter (Signed)
Dr Marchelle Gearing pls advise of test results and if ok to call in rx for cough syrup

## 2011-01-29 MED ORDER — BUDESONIDE-FORMOTEROL FUMARATE 80-4.5 MCG/ACT IN AERO: 2.0000 | INHALATION_SPRAY | Freq: Two times a day (BID) | RESPIRATORY_TRACT | Status: DC

## 2011-01-29 NOTE — Telephone Encounter (Signed)
Pt aware of results and he chose to have med sent and have ov 3-4 weeks after, med sent to [facility]. The pt is due for f/u appt the 1st part of august, a reminder appt has been put in for this.

## 2011-01-29 NOTE — Telephone Encounter (Signed)
Pt returning call can be reached at [phone].Timothy Lowe

## 2011-01-30 ENCOUNTER — Institutional Professional Consult (permissible substitution): Payer: Self-pay | Admitting: Emergency Medicine

## 2011-01-30 ENCOUNTER — Telehealth: Payer: Self-pay | Admitting: Internal Medicine

## 2011-01-30 MED ORDER — AMLODIPINE BESYLATE 10 MG PO TABS: ORAL_TABLET | ORAL | Status: DC

## 2011-01-30 NOTE — Telephone Encounter (Signed)
LMOMTCB- can he have his PCP refill this??

## 2011-01-30 NOTE — Telephone Encounter (Signed)
Pt returning call.Timothy Lowe ° °

## 2011-01-30 NOTE — Telephone Encounter (Signed)
Spoke with pt. He states that he has not been able to find a PCP yet, and is needing med as he is out completely. I advised will refill med for him since he is out. Rx was sent to pharm.

## 2011-01-31 ENCOUNTER — Telehealth: Payer: Self-pay | Admitting: Internal Medicine

## 2011-01-31 NOTE — Telephone Encounter (Signed)
Samples of symbicort left up front for him to pick up since could not afford rx at the pharmacy.

## 2011-02-05 ENCOUNTER — Telehealth: Payer: Self-pay | Admitting: Internal Medicine

## 2011-02-05 ENCOUNTER — Ambulatory Visit: Payer: 59 | Admitting: Internal Medicine

## 2011-02-05 NOTE — Telephone Encounter (Signed)
Scheduled pt to see MR today at 2:30--pt aware

## 2011-02-06 ENCOUNTER — Telehealth: Payer: Self-pay | Admitting: Internal Medicine

## 2011-02-06 ENCOUNTER — Telehealth: Payer: Self-pay | Admitting: *Deleted

## 2011-02-06 ENCOUNTER — Ambulatory Visit: Payer: 59 | Admitting: Internal Medicine

## 2011-02-06 NOTE — Telephone Encounter (Signed)
Patient has called about his appt today. He states if MR is not going to prescribe tussionex he will be better off seeing another doctor. I spoke to MR and based on the diagnosis of asthma, which the pt has been advised of, that he will not prescribe tussionex for the pt. I advised the pt of this and he states to cancel appt for today and he will see another doctor. Carron Curie, CMA

## 2011-02-06 NOTE — Telephone Encounter (Signed)
He has asthma and today is to reivw how he is doing. As I explained, I cannot see someone with the premise that I have to prescribe tussionex. He is welcome to see another doctor

## 2011-02-06 NOTE — Telephone Encounter (Signed)
Called # provided above - LMOMTCB 

## 2011-02-06 NOTE — Telephone Encounter (Signed)
Spoke with pt. He states with following all of MR's recs, he is still coughing until vomits, and "throat looks like ground beef". He states does not want to keep appt today, unless he will be given tussionex. He states that this is the only thing that helps. I advised that since cough is not improving, needs to keep appt with MR so he can discuss next step in therapy with him. He verbalized understanding and states will keep appt.

## 2011-02-13 ENCOUNTER — Other Ambulatory Visit: Payer: Self-pay | Admitting: *Deleted

## 2011-02-13 MED ORDER — AMLODIPINE BESYLATE 10 MG PO TABS: ORAL_TABLET | ORAL | Status: DC

## 2011-02-20 ENCOUNTER — Other Ambulatory Visit: Payer: Self-pay | Admitting: Internal Medicine

## 2011-04-30 ENCOUNTER — Emergency Department (HOSPITAL_COMMUNITY)
Admission: EM | Admit: 2011-04-30 | Discharge: 2011-04-30 | Disposition: A | Discharge status: Home / Self Care | Payer: 59 | Attending: Emergency Medicine | Admitting: Emergency Medicine

## 2011-04-30 ENCOUNTER — Emergency Department (HOSPITAL_COMMUNITY): Payer: 59

## 2011-04-30 DIAGNOSIS — I10 Essential (primary) hypertension: Secondary | ICD-10-CM | POA: Insufficient documentation

## 2011-04-30 DIAGNOSIS — S335XXA Sprain of ligaments of lumbar spine, initial encounter: Secondary | ICD-10-CM | POA: Insufficient documentation

## 2011-04-30 DIAGNOSIS — X500XXA Overexertion from strenuous movement or load, initial encounter: Secondary | ICD-10-CM | POA: Insufficient documentation

## 2011-05-07 LAB — DIFFERENTIAL
Basophils Relative: 0
Lymphocytes Relative: 22
Lymphs Abs: 2.4
Monocytes Relative: 4
Neutro Abs: 7.7
Neutrophils Relative %: 71

## 2011-05-07 LAB — COMPREHENSIVE METABOLIC PANEL
BUN: 10
Calcium: 8.5
Creatinine, Ser: 1.3
Glucose, Bld: 106 — ABNORMAL HIGH
Total Protein: 6.3

## 2011-05-07 LAB — CBC
HCT: 42.1
Hemoglobin: 14.3
MCHC: 34
RDW: 13.3

## 2011-05-18 LAB — ANAEROBIC CULTURE: Gram Stain: NONE SEEN

## 2011-05-18 LAB — WOUND CULTURE: Gram Stain: NONE SEEN

## 2011-05-18 LAB — BASIC METABOLIC PANEL
BUN: 10
CO2: 30
Calcium: 9.3
Chloride: 102
Creatinine, Ser: 0.81
GFR calc Af Amer: >60
GFR calc non Af Amer: >60
Glucose, Bld: 109 — ABNORMAL HIGH
Potassium: 3.6
Sodium: 138

## 2011-05-18 LAB — CBC
HCT: 43.8
Hemoglobin: 14.9
MCHC: 34.1
MCV: 89.5
Platelets: 331
RBC: 4.89
RDW: 13.7
WBC: 5.5

## 2011-07-03 ENCOUNTER — Telehealth: Payer: Self-pay | Admitting: Internal Medicine

## 2011-07-03 NOTE — Telephone Encounter (Signed)
See previous phone notes, pt stated he was going to see another doctor. He has not been seen here since June. Need to know if pt is going to f/u here or has he seen another doctor before sample is given. Timothy Lowe, CMA

## 2011-07-04 NOTE — Telephone Encounter (Signed)
Pt states he is going to follow-up with MR for his pulmonary issues. He isrequesting samples of the Symbicort 80/4.5 and albuterol inhaler for rescue purposes. Pt states he is not using the Symbicort on a regular basis and only uses this when he needs it. 1 Symbicort sample has been left for pt to pick up. We do not have any samples of a SABA. Pt has been scheduled to see TP on 12/10@ 10:15am so they can discuss what inhalers he needs to be using and how often. Pt will then schedule a follow-up with MR.

## 2011-07-16 ENCOUNTER — Ambulatory Visit: Payer: 59 | Admitting: Adult Health

## 2011-08-08 ENCOUNTER — Emergency Department (HOSPITAL_COMMUNITY)
Admission: EM | Admit: 2011-08-08 | Discharge: 2011-08-08 | Discharge status: Against Medical Advice | Payer: 59 | Attending: Emergency Medicine | Admitting: Emergency Medicine

## 2011-08-08 DIAGNOSIS — Z0389 Encounter for observation for other suspected diseases and conditions ruled out: Secondary | ICD-10-CM | POA: Insufficient documentation

## 2011-08-13 ENCOUNTER — Ambulatory Visit: Payer: 59 | Admitting: Adult Health

## 2011-09-09 ENCOUNTER — Ambulatory Visit (INDEPENDENT_AMBULATORY_CARE_PROVIDER_SITE_OTHER): Payer: 59 | Admitting: Family Medicine

## 2011-09-09 VITALS — BP 130/90 | HR 93 | Temp 97.8°F | Resp 16 | Ht 72.0 in | Wt 237.0 lb

## 2011-09-09 DIAGNOSIS — G4700 Insomnia, unspecified: Secondary | ICD-10-CM

## 2011-09-09 MED ORDER — ZOLPIDEM TARTRATE 10 MG PO TABS: 10.0000 mg | ORAL_TABLET | Freq: Every evening | ORAL | Status: DC | PRN

## 2011-09-09 NOTE — Progress Notes (Signed)
  Subjective:    Patient ID: Timothy Lowe, male    DOB: 1966/07/03, 46 y.o.   MRN: [id number]  HPI 46 yo male with c/o insomnia (in addition to many other problems)  Periodically struggles with insomnia.  Has new PCP appt at the end of the month but can't wait until then.  Has only slept an hour at atime the last 3 days.  Travels a lot for job.  Difficulty sleeping away from home.  Ambien has always helped without hangover symptoms in the morning.  Doesn't have current prescription.  Last took 3 months ago after a death in the family.   Otherwise well.  No complaints.     Review of Systems Neg x as per HPI    Objective:   Physical Exam  Constitutional: He appears well-developed and well-nourished.  HENT:  Nose: Nose normal.  Mouth/Throat: Oropharynx is clear and moist.  Cardiovascular: Normal rate, regular rhythm, normal heart sounds and intact distal pulses.   Pulmonary/Chest: Effort normal and breath sounds normal.          Assessment & Plan:  Insomnia - Ambien 10, 1 month supply, until he gets into PCP

## 2011-10-31 ENCOUNTER — Encounter (HOSPITAL_BASED_OUTPATIENT_CLINIC_OR_DEPARTMENT_OTHER): Payer: Self-pay

## 2011-10-31 ENCOUNTER — Emergency Department (HOSPITAL_BASED_OUTPATIENT_CLINIC_OR_DEPARTMENT_OTHER)
Admission: EM | Admit: 2011-10-31 | Discharge: 2011-10-31 | Disposition: A | Discharge status: Home Health | Payer: 59 | Attending: Emergency Medicine | Admitting: Emergency Medicine

## 2011-10-31 DIAGNOSIS — K219 Gastro-esophageal reflux disease without esophagitis: Secondary | ICD-10-CM | POA: Insufficient documentation

## 2011-10-31 DIAGNOSIS — Z79899 Other long term (current) drug therapy: Secondary | ICD-10-CM | POA: Insufficient documentation

## 2011-10-31 DIAGNOSIS — G4700 Insomnia, unspecified: Secondary | ICD-10-CM | POA: Insufficient documentation

## 2011-10-31 DIAGNOSIS — Z765 Malingerer [conscious simulation]: Secondary | ICD-10-CM

## 2011-10-31 DIAGNOSIS — F329 Major depressive disorder, single episode, unspecified: Secondary | ICD-10-CM | POA: Insufficient documentation

## 2011-10-31 DIAGNOSIS — E785 Hyperlipidemia, unspecified: Secondary | ICD-10-CM | POA: Insufficient documentation

## 2011-10-31 DIAGNOSIS — I10 Essential (primary) hypertension: Secondary | ICD-10-CM | POA: Insufficient documentation

## 2011-10-31 DIAGNOSIS — F3289 Other specified depressive episodes: Secondary | ICD-10-CM | POA: Insufficient documentation

## 2011-10-31 DIAGNOSIS — F411 Generalized anxiety disorder: Secondary | ICD-10-CM | POA: Insufficient documentation

## 2011-10-31 MED ORDER — DIPHENHYDRAMINE HCL 50 MG/ML IJ SOLN: 25.0000 mg | Freq: Once | INTRAMUSCULAR | Status: DC

## 2011-10-31 NOTE — ED Provider Notes (Signed)
Medical screening examination/treatment/procedure(s) were performed by non-physician practitioner and as supervising physician I was immediately available for consultation/collaboration.   Promiss Labarbera A Demarion Pondexter, MD 10/31/11 2353 

## 2011-10-31 NOTE — ED Provider Notes (Signed)
History     CSN: [id number]  Arrival date & time 10/31/11  1538   First MD Initiated Contact with Patient 10/31/11 1604      Chief Complaint  Patient presents with  . Insomnia    (Consider location/radiation/quality/duration/timing/severity/associated sxs/prior treatment) Patient is a 46 y.o. male presenting with mental health disorder. The history is provided by the patient. No language interpreter was used.  Mental Health Problem Primary symptoms comment: insomnia Episode onset: 7 days.  He does not contemplate harming himself. He has not already injured self.  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  Pt reports no local MD.  Pt is looking for rx for ambien.  Pt reports he has a lot of stress and is not sleeping  Past Medical History  Diagnosis Date  . GERD (gastroesophageal reflux disease)   . Depression   . Hypertension   . Hyperlipidemia   . Eczema   . Anxiety   . Insomnia     No past surgical history on file.  No family history on file.  History  Substance Use Topics  . Smoking status: Never Smoker   . Smokeless tobacco: Not on file  . Alcohol Use: No      Review of Systems  All other systems reviewed and are negative.    Allergies  Review of patient's allergies indicates no known allergies.  Home Medications   Current Outpatient Rx  Name Route Sig Dispense Refill  . ALBUTEROL SULFATE HFA 108 (90 BASE) MCG/ACT IN AERS Inhalation Inhale 2 puffs into the lungs every 4 (four) hours as needed.      Marland Kitchen AMLODIPINE BESYLATE 10 MG PO TABS  No more refills unless patient has appointment with PCP. 30 tablet 0    1 by mouth daily No more refills unless patient h ...  . BUDESONIDE-FORMOTEROL FUMARATE 80-4.5 MCG/ACT IN AERO Inhalation Inhale 2 puffs into the lungs 2 (two) times daily. 1 Inhaler 12  . BUPROPION HCL ER (XL) 150 MG PO TB24  1 by mouth per [facility]     . HYDROCOD POLST-CPM POLST ER 10-8 MG/5ML PO LQCR  Take 1 tsp every 12 hours as needed 150 mL 0   . ESCITALOPRAM OXALATE 20 MG PO TABS  1 1/2 by mouth daily per [facility]     . FLUOXETINE HCL 40 MG PO CAPS Oral Take 40 mg by mouth daily.    Marland Kitchen NAPROXEN 500 MG PO TABS Oral Take 500 mg by mouth 2 (two) times daily with a meal.      . OMEPRAZOLE 20 MG PO CPDR Oral Take 2 capsules (40 mg total) by mouth daily.    Marland Kitchen PRAVASTATIN SODIUM 40 MG PO TABS Oral Take 1 tablet (40 mg total) by mouth at bedtime. 30 tablet 6  . TRIAMCINOLONE ACETONIDE 0.1 % EX CREA  APPLY TOPICALLY 2 (TWO) TIMES DAILY. UNTIL SKIN IS HEAL 45 g 0  . ZOLPIDEM TARTRATE 10 MG PO TABS Oral Take 1 tablet (10 mg total) by mouth at bedtime as needed for sleep. 30 tablet 0    BP 162/102  Pulse 82  Temp 98 F (36.7 C)  Resp 18  Ht 6' (1.829 m)  Wt 240 lb (108.863 kg)  BMI 32.55 kg/m2  SpO2 97%  Physical Exam  Nursing note and vitals reviewed. Constitutional: He is oriented to person, place, and time. He appears well-developed and well-nourished.  HENT:  Head: Normocephalic and atraumatic.  Right Ear: External ear normal.  Left  Ear: External ear normal.  Nose: Nose normal.  Mouth/Throat: Oropharynx is clear and moist.  Eyes: Conjunctivae and EOM are normal. Pupils are equal, round, and reactive to light.  Neck: Normal range of motion. Neck supple.  Cardiovascular: Normal rate and normal heart sounds.   Pulmonary/Chest: Effort normal and breath sounds normal.  Abdominal: Soft. Bowel sounds are normal.  Musculoskeletal: Normal range of motion.  Neurological: He is alert and oriented to person, place, and time. He has normal reflexes.  Skin: Skin is warm and dry.  Psychiatric: He has a normal mood and affect.    ED Course  Procedures (including critical care time)  Labs Reviewed - No data to display No results found.   No diagnosis found.    MDM  Pt has filled 357 ambien since 06/26/11.   I counseled pt about drug seeking behavior.  I advised him it is illegal to obtain controlled medications from  multiple providers.  I will not give Rx from here.   Pt also has multiple RX's for ativan and hydrocodone.   I offered help getting into treatment if needed.  Pt declined.  He does not seem to be in withdrawal at this time        Elson Areas, [place] 10/31/11 1711

## 2011-10-31 NOTE — ED Notes (Signed)
Pt here for refill on Ambien 10mg .  Pt states he has had insomnia for years but hasn't slept in past 4 days.  Pt attempted to make appt with PCP but couldn't get an appt, so he came here.

## 2011-10-31 NOTE — ED Notes (Signed)
Pt amb to triage with quick steady gait in nad. Pt smiling and laughing,states he has had insomnia for 3 nights and could not get apt with his pcp, was told to come to er.

## 2011-10-31 NOTE — Discharge Instructions (Signed)

## 2012-07-09 ENCOUNTER — Ambulatory Visit (INDEPENDENT_AMBULATORY_CARE_PROVIDER_SITE_OTHER): Payer: 59 | Admitting: Physician Assistant

## 2012-07-09 VITALS — BP 148/92 | HR 69 | Temp 98.3°F | Resp 16 | Ht 71.0 in | Wt 246.0 lb

## 2012-07-09 DIAGNOSIS — Z23 Encounter for immunization: Secondary | ICD-10-CM

## 2012-07-09 DIAGNOSIS — G4700 Insomnia, unspecified: Secondary | ICD-10-CM

## 2012-07-09 DIAGNOSIS — F341 Dysthymic disorder: Secondary | ICD-10-CM

## 2012-07-09 DIAGNOSIS — I10 Essential (primary) hypertension: Secondary | ICD-10-CM

## 2012-07-09 MED ORDER — ZOLPIDEM TARTRATE 10 MG PO TABS: 10.0000 mg | ORAL_TABLET | Freq: Every evening | ORAL | Status: DC | PRN
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
## 2012-07-09 NOTE — Patient Instructions (Signed)
Plan to come in for a complete physical in January.  If you have not received a call from our schedulers by the end of the week, please call the office ask ask to speak with a scheduler.

## 2012-07-10 NOTE — Progress Notes (Signed)
Contacted patient to schedule a CPE and he said he would prefer to contact us to make the appt. After the holidays

## 2012-07-31 ENCOUNTER — Other Ambulatory Visit: Payer: Self-pay | Admitting: Internal Medicine

## 2012-08-02 ENCOUNTER — Telehealth: Payer: Self-pay

## 2012-08-02 NOTE — Telephone Encounter (Signed)
Patient calling to check the status of his rx refill  [phone]

## 2012-08-02 NOTE — Telephone Encounter (Signed)
PT STATES THAT HE IS GOING OUT OF TOWN FOR 14 DAYS AND WOULD LIKE A REFILL ON THE AMBIEN THAT HE IS NEEDING A OV AND LABS FOR. HE STATES THAT HE WILL BE ABLE TO SCHEDULE A PE AFTER HIS VACATION. BEST# [phone] PHARMACY:[facility] [address]

## 2012-08-03 NOTE — Telephone Encounter (Signed)
Patient informed yesterday (08/02/12) he is too early for this refill. On [place] DEA controlled substance database patient has received zolpidem #14 with 3 RF on 07/23/12 from Tomma Lightning, MD as well as #14 with 3 RF on 07/21/12. He has received controlled substances from several providers and has used different pharmacies. I would recommend OV before any additional refills.

## 2012-08-07 ENCOUNTER — Ambulatory Visit (INDEPENDENT_AMBULATORY_CARE_PROVIDER_SITE_OTHER): Payer: 59 | Admitting: Family Medicine

## 2012-08-07 VITALS — BP 137/84 | HR 73 | Temp 98.1°F | Resp 16 | Ht 72.0 in | Wt 232.0 lb

## 2012-08-07 DIAGNOSIS — Z13220 Encounter for screening for lipoid disorders: Secondary | ICD-10-CM

## 2012-08-07 DIAGNOSIS — F32A Depression, unspecified: Secondary | ICD-10-CM

## 2012-08-07 DIAGNOSIS — F3289 Other specified depressive episodes: Secondary | ICD-10-CM

## 2012-08-07 DIAGNOSIS — G4700 Insomnia, unspecified: Secondary | ICD-10-CM

## 2012-08-07 DIAGNOSIS — F329 Major depressive disorder, single episode, unspecified: Secondary | ICD-10-CM

## 2012-08-07 DIAGNOSIS — I10 Essential (primary) hypertension: Secondary | ICD-10-CM

## 2012-08-07 LAB — POCT CBC
Granulocyte percent: 63.2 % (ref 37–80)
HCT, POC: 47.2 % (ref 43.5–53.7)
Hemoglobin: 14.9 g/dL (ref 14.1–18.1)
Lymph, poc: 2.5 (ref 0.6–3.4)
MCH, POC: 28.8 pg (ref 27–31.2)
MCHC: 31.6 g/dL — AB (ref 31.8–35.4)
MCV: 91.3 fL (ref 80–97)
MID (cbc): 0.7 (ref 0–0.9)
MPV: 8.9 fL (ref 0–99.8)
POC Granulocyte: 5.6 (ref 2–6.9)
POC LYMPH PERCENT: 28.3 %L (ref 10–50)
POC MID %: 8.5 % (ref 0–12)
Platelet Count, POC: 363 10*3/uL (ref 142–424)
RBC: 5.17 M/uL (ref 4.69–6.13)
RDW, POC: 14.7 %
WBC: 8.8 10*3/uL (ref 4.6–10.2)

## 2012-08-07 MED ORDER — FLUOXETINE HCL 40 MG PO CAPS: 40.0000 mg | ORAL_CAPSULE | Freq: Every day | ORAL | Status: DC

## 2012-08-07 MED ORDER — ZOLPIDEM TARTRATE 10 MG PO TABS: 10.0000 mg | ORAL_TABLET | Freq: Every evening | ORAL | Status: DC | PRN
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
## 2012-08-08 LAB — COMPREHENSIVE METABOLIC PANEL WITH GFR
ALT: 61 U/L — ABNORMAL HIGH (ref 0–53)
AST: 33 U/L (ref 0–37)
Albumin: 4.3 g/dL (ref 3.5–5.2)
Alkaline Phosphatase: 87 U/L (ref 39–117)
Glucose, Bld: 109 mg/dL — ABNORMAL HIGH (ref 70–99)
Potassium: 4.2 meq/L (ref 3.5–5.3)
Sodium: 138 meq/L (ref 135–145)
Total Bilirubin: 0.4 mg/dL (ref 0.3–1.2)
Total Protein: 6.8 g/dL (ref 6.0–8.3)

## 2012-08-08 LAB — COMPREHENSIVE METABOLIC PANEL
BUN: 7 mg/dL (ref 6–23)
CO2: 25 mEq/L (ref 19–32)
Calcium: 9.1 mg/dL (ref 8.4–10.5)
Chloride: 104 mEq/L (ref 96–112)
Creat: 0.86 mg/dL (ref 0.50–1.35)

## 2012-08-08 LAB — LDL CHOLESTEROL, DIRECT: Direct LDL: 153 mg/dL — ABNORMAL HIGH

## 2012-08-08 LAB — TSH: TSH: 1.444 u[IU]/mL (ref 0.350–4.500)

## 2012-08-10 ENCOUNTER — Telehealth: Payer: Self-pay

## 2012-08-10 DIAGNOSIS — G4700 Insomnia, unspecified: Secondary | ICD-10-CM

## 2012-08-10 NOTE — Telephone Encounter (Signed)
PT CALLED STATING THAT HE LEFT HIS SLEEP MEDICATION OUT OF TOWN, AND NEEDS A REFILL  BEST NUMBER [phone]

## 2012-08-11 MED ORDER — ZOLPIDEM TARTRATE 10 MG PO TABS: 10.0000 mg | ORAL_TABLET | Freq: Every evening | ORAL | Status: DC | PRN

## 2012-08-11 NOTE — Telephone Encounter (Signed)
At Tl desk - please let pt know that he might have to pay out of pocket for the medication because per insurance it will be really early.

## 2012-08-11 NOTE — Telephone Encounter (Signed)
Called Rx in for him. Called patient to advise.

## 2012-08-11 NOTE — Telephone Encounter (Signed)
He states he left it at the hotel room, and needs replaced, please advise. Ambien 10 mg. He states was a full bottle, he just had filled. He called housekeeping and it was not turned in at the hotel.

## 2012-08-11 NOTE — Telephone Encounter (Signed)
When can he get his medication?  I can send in enough to cover until he can get his because it was just written 08/07/2012.

## 2012-08-11 NOTE — Telephone Encounter (Signed)
Can we refill? 

## 2012-08-11 NOTE — Telephone Encounter (Signed)
LMOM to CB, see if he is going to get his meds sent to him or he has lost them completely?

## 2012-08-27 ENCOUNTER — Ambulatory Visit: Payer: 59

## 2012-09-01 ENCOUNTER — Telehealth: Payer: Self-pay

## 2012-09-01 NOTE — Telephone Encounter (Signed)
Spoke with pt advised lab results. I also advised we couldn't refill his Lorazepam because he received a RX from Dr Hal Hope on 08/25/2012 for #60 tablets. Pt understood.

## 2012-09-01 NOTE — Telephone Encounter (Signed)
Spoke with pt, he states nobody called him in regards to his labs, can you review and let me know what to tell pt. He also would like to know if you can help him with some anxiety issues he is having. He doesn't see his psychiatrist anymore and he was RXd Lorazepam and hasnt needed it but he is going through a tax audit and feels this medication would help him get through this process. Can you RX this for him, he states we will be his primary care for now on but cannot afford to go to his psychiatrist anymore. Please advise.

## 2012-09-01 NOTE — Telephone Encounter (Signed)
PT WOULD LIKE TO SPEAK WITH SOMEONE REGARDING MEDICINE. PLEASE CALL [phone]

## 2012-10-15 ENCOUNTER — Telehealth: Payer: Self-pay

## 2012-10-15 NOTE — Telephone Encounter (Signed)
Left voice mail unable to fill rx for him without office visit first.

## 2012-10-15 NOTE — Telephone Encounter (Signed)
Patient lost his job and place to stay - discussed getting some xanax  [phone]

## 2012-11-07 ENCOUNTER — Ambulatory Visit (INDEPENDENT_AMBULATORY_CARE_PROVIDER_SITE_OTHER): Payer: Self-pay | Admitting: Emergency Medicine

## 2012-11-07 VITALS — BP 124/90 | HR 72 | Temp 98.1°F | Resp 16 | Ht 72.0 in | Wt 223.0 lb

## 2012-11-07 DIAGNOSIS — I10 Essential (primary) hypertension: Secondary | ICD-10-CM

## 2012-11-07 DIAGNOSIS — G4700 Insomnia, unspecified: Secondary | ICD-10-CM

## 2012-11-07 DIAGNOSIS — R739 Hyperglycemia, unspecified: Secondary | ICD-10-CM

## 2012-11-07 DIAGNOSIS — F411 Generalized anxiety disorder: Secondary | ICD-10-CM

## 2012-11-07 DIAGNOSIS — R748 Abnormal levels of other serum enzymes: Secondary | ICD-10-CM

## 2012-11-07 DIAGNOSIS — R7309 Other abnormal glucose: Secondary | ICD-10-CM

## 2012-11-07 LAB — HEPATIC FUNCTION PANEL
ALT: 24 U/L (ref 0–53)
AST: 22 U/L (ref 0–37)
Albumin: 4.3 g/dL (ref 3.5–5.2)
Total Bilirubin: 0.5 mg/dL (ref 0.3–1.2)

## 2012-11-07 MED ORDER — LORAZEPAM 1 MG PO TABS: 1.0000 mg | ORAL_TABLET | Freq: Two times a day (BID) | ORAL | Status: DC | PRN

## 2012-11-07 MED ORDER — ZOLPIDEM TARTRATE 10 MG PO TABS: 10.0000 mg | ORAL_TABLET | Freq: Every evening | ORAL | Status: DC | PRN

## 2012-11-07 MED ORDER — FLUOXETINE HCL 40 MG PO CAPS: 40.0000 mg | ORAL_CAPSULE | Freq: Every day | ORAL | Status: DC

## 2012-11-07 MED ORDER — LOSARTAN POTASSIUM 100 MG PO TABS: 100.0000 mg | ORAL_TABLET | Freq: Every day | ORAL | Status: DC

## 2012-11-07 NOTE — Progress Notes (Signed)
  Subjective:    Patient ID: Timothy Lowe, male    DOB: November 14, 1965, 47 y.o.   MRN: [id number]  HPI  Pt presents today with multiple issues"  1. Recently lost job and lives at home with his mom. Very stressed. Has taken Lorazepam in the past for job stresses. Hasn't taken since Jan. Is on Prozac 40. Has taken that since divorce in 1999.  No kids, no thoughts of suicide  2. Pt has a h/o insomnia from past jobs that require travel. Used to take Ambien, but it would only last 3-4 hours before waking up and taking another half of Ambien. Pharm advised pt to get seen for a rx for Intermezzo to help him stay asleep. Pt has recently started exercising. Doesn't drink caffeine after 2 p.m. Has never tried melatonin.  3. Wants to discuss labs from 08/2012 OV. One LFT elevated. Doesn't drink etoh, no h/o of liver diseases.  4. Went hiking yesterday and injured right knee after tripping and falling into a creek. Started hurting this morning.    Review of Systems     Objective:   Physical Exam patient is alert and cooperative and not in any distress. His neck is supple. Chest is clear to auscultation and percussion cardiac exam reveals a regular rate and rhythm without murmurs rubs or gallops. The abdomen is soft liver spleen not enlarged there are no areas of tenderness.  Results for orders placed in visit on 11/07/12  GLUCOSE, POCT (MANUAL RESULT ENTRY)      Result Value Range   POC Glucose 122 (*) 70 - 99 mg/dl  POCT GLYCOSYLATED HEMOGLOBIN (HGB A1C)      Result Value Range   Hemoglobin A1C 6.3     There is a small bruise-like area laterally superiorly on the right knee there is good range of motion there is a negative drawer negative McMurray no fluid present .     Assessment & Plan:  Pt given Beck's Depression and Zung Anxiety papers to fill out Totaled 46 on the anxiety index and 7 on the depression scale  DEA printed and is consistent with what pt states.   Melatonin 2 mg to  take with Ambien. No more than 10mg  Ambien qhs. Pt understands and agrees. Restart Lorazepam. Not to take with Ambien at night. Ibuprofen and ice for knee patient advised he is prediabetic with A1c of 6.3. He will treat this with diet and exercise and weight loss recheck 3-4 months and add Glucophage if still abnormal at that time

## 2012-11-07 NOTE — Patient Instructions (Addendum)

## 2012-11-11 ENCOUNTER — Emergency Department (HOSPITAL_COMMUNITY)
Admission: EM | Admit: 2012-11-11 | Discharge: 2012-11-11 | Disposition: A | Discharge status: Home / Self Care | Payer: 59 | Attending: Emergency Medicine | Admitting: Emergency Medicine

## 2012-11-11 ENCOUNTER — Emergency Department (HOSPITAL_COMMUNITY): Payer: 59

## 2012-11-11 ENCOUNTER — Encounter (HOSPITAL_COMMUNITY): Payer: Self-pay | Admitting: Cardiology

## 2012-11-11 DIAGNOSIS — Z8639 Personal history of other endocrine, nutritional and metabolic disease: Secondary | ICD-10-CM | POA: Insufficient documentation

## 2012-11-11 DIAGNOSIS — S20219A Contusion of unspecified front wall of thorax, initial encounter: Secondary | ICD-10-CM | POA: Insufficient documentation

## 2012-11-11 DIAGNOSIS — I10 Essential (primary) hypertension: Secondary | ICD-10-CM | POA: Insufficient documentation

## 2012-11-11 DIAGNOSIS — G4700 Insomnia, unspecified: Secondary | ICD-10-CM | POA: Insufficient documentation

## 2012-11-11 DIAGNOSIS — F411 Generalized anxiety disorder: Secondary | ICD-10-CM | POA: Insufficient documentation

## 2012-11-11 DIAGNOSIS — F329 Major depressive disorder, single episode, unspecified: Secondary | ICD-10-CM | POA: Insufficient documentation

## 2012-11-11 DIAGNOSIS — S20212A Contusion of left front wall of thorax, initial encounter: Secondary | ICD-10-CM

## 2012-11-11 DIAGNOSIS — W1789XA Other fall from one level to another, initial encounter: Secondary | ICD-10-CM | POA: Insufficient documentation

## 2012-11-11 DIAGNOSIS — Z862 Personal history of diseases of the blood and blood-forming organs and certain disorders involving the immune mechanism: Secondary | ICD-10-CM | POA: Insufficient documentation

## 2012-11-11 DIAGNOSIS — Y9289 Other specified places as the place of occurrence of the external cause: Secondary | ICD-10-CM | POA: Insufficient documentation

## 2012-11-11 DIAGNOSIS — F3289 Other specified depressive episodes: Secondary | ICD-10-CM | POA: Insufficient documentation

## 2012-11-11 DIAGNOSIS — Z872 Personal history of diseases of the skin and subcutaneous tissue: Secondary | ICD-10-CM | POA: Insufficient documentation

## 2012-11-11 DIAGNOSIS — Y9301 Activity, walking, marching and hiking: Secondary | ICD-10-CM | POA: Insufficient documentation

## 2012-11-11 DIAGNOSIS — K219 Gastro-esophageal reflux disease without esophagitis: Secondary | ICD-10-CM | POA: Insufficient documentation

## 2012-11-11 MED ORDER — TRAMADOL HCL 50 MG PO TABS: 50.0000 mg | ORAL_TABLET | Freq: Four times a day (QID) | ORAL | Status: DC | PRN

## 2012-11-11 MED ORDER — IBUPROFEN 600 MG PO TABS: 600.0000 mg | ORAL_TABLET | Freq: Four times a day (QID) | ORAL | Status: AC | PRN

## 2012-11-11 NOTE — ED Notes (Signed)
Pt reports he went for a hike yesterday and fell on his left side from a small bridge. States last night he felt ok but during the night into this morning the pain became worse. Increased pain with breathing.

## 2012-11-11 NOTE — ED Provider Notes (Signed)
History     CSN: [id number]  Arrival date & time 11/11/12  4098   First MD Initiated Contact with Patient 11/11/12 [phone]      Chief Complaint  Patient presents with  . Pain    (Consider location/radiation/quality/duration/timing/severity/associated sxs/prior treatment) HPI Comments: Patient presents with complaint of left-sided rib pain that began after a fall during a hike he states was  2 days ago. Patient states that he fell approximately 2 feet off a bridge [place]. States that the pain was worse this morning. It is increased with deep breathing, movement, palpation of the area. He denies shortness of breath. He denies neck pain or other injury. Initially his right knee was hurting but this is resolved. Patient states he saw his primary care physician yesterday. PCP note from 11/07/2012 notes knee pain after a fall during hiking. Patient denies other complaints. Onset of symptoms gradual. Course is constant. He has been taking ibuprofen without relief. Nothing makes symptoms better.  The history is provided by the patient.    Past Medical History  Diagnosis Date  . GERD (gastroesophageal reflux disease)   . Depression   . Hypertension   . Hyperlipidemia   . Eczema   . Anxiety   . Insomnia   . Allergy     Past Surgical History  Procedure Laterality Date  . Hernia repair      Family History  Problem Relation Age of Onset  . Diabetes Sister     History  Substance Use Topics  . Smoking status: Never Smoker   . Smokeless tobacco: Former Neurosurgeon    Types: Chew  . Alcohol Use: No      Review of Systems  Constitutional: Negative for fever.  HENT: Negative for sore throat and rhinorrhea.   Eyes: Negative for redness.  Respiratory: Negative for cough and shortness of breath.   Cardiovascular: Positive for chest pain (rib pain).  Gastrointestinal: Negative for nausea, vomiting, abdominal pain and diarrhea.  Genitourinary: Negative for dysuria.  Musculoskeletal:  Negative for myalgias.  Skin: Negative for rash.  Neurological: Negative for headaches.    Allergies  Review of patient's allergies indicates no known allergies.  Home Medications   Current Outpatient Rx  Name  Route  Sig  Dispense  Refill  . FLUoxetine (PROZAC) 40 MG capsule   Oral   Take 1 capsule (40 mg total) by mouth daily.   30 capsule   11   . LORazepam (ATIVAN) 1 MG tablet   Oral   Take 1 tablet (1 mg total) by mouth 2 (two) times daily as needed for anxiety.   60 tablet   5     The patient can refill the medication every 30 day ...   . losartan (COZAAR) 100 MG tablet   Oral   Take 1 tablet (100 mg total) by mouth daily.   30 tablet   11   . omeprazole (PRILOSEC) 20 MG capsule   Oral   Take 2 capsules (40 mg total) by mouth daily.         Marland Kitchen zolpidem (AMBIEN) 10 MG tablet   Oral   Take 1 tablet (10 mg total) by mouth at bedtime as needed for sleep.   30 tablet   5     Patient cannot fill this earlier than every 30 day ...     BP 143/105  Pulse 78  Temp(Src) 98.7 F (37.1 C) (Oral)  Resp 16  SpO2 96%  Physical Exam  Nursing note and vitals reviewed. Constitutional: He appears well-developed and well-nourished.  HENT:  Head: Normocephalic and atraumatic.  Eyes: Conjunctivae are normal. Right eye exhibits no discharge. Left eye exhibits no discharge.  Neck: Normal range of motion. Neck supple.  Cardiovascular: Normal rate, regular rhythm and normal heart sounds.   Pulmonary/Chest: Breath sounds normal. No respiratory distress. He has no rales. He exhibits tenderness.  Patient with left inferior anterior rib cage pain palpation. No step-off or deformity. Very mild bruising over the area.  Abdominal: Soft. There is no tenderness.  Neurological: He is alert.  Skin: Skin is warm and dry.  Psychiatric: He has a normal mood and affect.    ED Course  Procedures (including critical care time)  Labs Reviewed - No data to display Dg Chest 2  View  11/11/2012  *RADIOLOGY REPORT*  Clinical Data: Left-sided rib pain after fall  CHEST - 2 VIEW  Comparison: Dec 19, 2010.  Findings: Cardiomediastinal silhouette appears normal.  No acute pulmonary disease is noted.  Bony thorax is intact.  IMPRESSION: No acute cardiopulmonary abnormality seen.   Original Report Authenticated By: Lupita Raider.,  M.D.      1. Rib contusion, left, initial encounter     9:41 AM Patient seen and examined. CXR ordered.   Vital signs reviewed and are as follows: Filed Vitals:   11/11/12 0935  BP: 143/105  Pulse: 78  Temp: 98.7 F (37.1 C)  Resp: 16   Chest x-ray reviewed by myself.  I asked the patient when his injury occurred and he again stated 2 days ago. I then asked him about the discrepancy between his current report and his PCP note and states "I guess it happened last weekend". Per PCP note, injury occurred at least 5 days ago (Thursday).   Patient counseled to take 10 deep breaths every hour while awake.  Patient urged to return with worsening symptoms, difficulty breathing, or other concerns.  I have given the patient #10 Ultram. Patient counseled on use of narcotic-like pain medications. Counseled not to combine these medications with others containing tylenol. Urged not to drink alcohol, drive, or perform any other activities that requires focus while taking these medications. The patient verbalizes understanding and agrees with the plan.  MDM  Rib contusion versus occult fracture. X-ray negative. No respiratory distress, normal lung sounds.  I'm concerned about drug-seeking behavior with this patient given discrepancy between history and PCP note. Patient is alert and oriented x4 and does not appear confused.        Renne Crigler, PA-C 11/11/12 1105

## 2012-11-11 NOTE — ED Provider Notes (Signed)
  Medical screening examination/treatment/procedure(s) were performed by non-physician practitioner and as supervising physician I was immediately available for consultation/collaboration.    Gerhard Munch, MD 11/11/12 1539

## 2012-11-19 ENCOUNTER — Emergency Department (HOSPITAL_COMMUNITY)
Admission: EM | Admit: 2012-11-19 | Discharge: 2012-11-19 | Disposition: A | Discharge status: Home / Self Care | Payer: Self-pay | Attending: Emergency Medicine | Admitting: Emergency Medicine

## 2012-11-19 ENCOUNTER — Emergency Department (HOSPITAL_COMMUNITY): Payer: Self-pay

## 2012-11-19 ENCOUNTER — Encounter (HOSPITAL_COMMUNITY): Payer: Self-pay | Admitting: Family Medicine

## 2012-11-19 DIAGNOSIS — S2232XG Fracture of one rib, left side, subsequent encounter for fracture with delayed healing: Secondary | ICD-10-CM

## 2012-11-19 DIAGNOSIS — I10 Essential (primary) hypertension: Secondary | ICD-10-CM | POA: Insufficient documentation

## 2012-11-19 DIAGNOSIS — R0789 Other chest pain: Secondary | ICD-10-CM

## 2012-11-19 DIAGNOSIS — Z862 Personal history of diseases of the blood and blood-forming organs and certain disorders involving the immune mechanism: Secondary | ICD-10-CM | POA: Insufficient documentation

## 2012-11-19 DIAGNOSIS — K219 Gastro-esophageal reflux disease without esophagitis: Secondary | ICD-10-CM | POA: Insufficient documentation

## 2012-11-19 DIAGNOSIS — Z872 Personal history of diseases of the skin and subcutaneous tissue: Secondary | ICD-10-CM | POA: Insufficient documentation

## 2012-11-19 DIAGNOSIS — W1789XA Other fall from one level to another, initial encounter: Secondary | ICD-10-CM | POA: Insufficient documentation

## 2012-11-19 DIAGNOSIS — Z8639 Personal history of other endocrine, nutritional and metabolic disease: Secondary | ICD-10-CM | POA: Insufficient documentation

## 2012-11-19 DIAGNOSIS — F411 Generalized anxiety disorder: Secondary | ICD-10-CM | POA: Insufficient documentation

## 2012-11-19 DIAGNOSIS — Y9301 Activity, walking, marching and hiking: Secondary | ICD-10-CM | POA: Insufficient documentation

## 2012-11-19 DIAGNOSIS — Z79899 Other long term (current) drug therapy: Secondary | ICD-10-CM | POA: Insufficient documentation

## 2012-11-19 DIAGNOSIS — S2239XA Fracture of one rib, unspecified side, initial encounter for closed fracture: Secondary | ICD-10-CM | POA: Insufficient documentation

## 2012-11-19 DIAGNOSIS — F329 Major depressive disorder, single episode, unspecified: Secondary | ICD-10-CM | POA: Insufficient documentation

## 2012-11-19 DIAGNOSIS — F3289 Other specified depressive episodes: Secondary | ICD-10-CM | POA: Insufficient documentation

## 2012-11-19 DIAGNOSIS — Y9289 Other specified places as the place of occurrence of the external cause: Secondary | ICD-10-CM | POA: Insufficient documentation

## 2012-11-19 DIAGNOSIS — G4700 Insomnia, unspecified: Secondary | ICD-10-CM | POA: Insufficient documentation

## 2012-11-19 MED ORDER — OXYCODONE-ACETAMINOPHEN 5-325 MG PO TABS
2.0000 | ORAL_TABLET | Freq: Once | ORAL | Status: AC
  Administered 2012-11-19: 2 via ORAL
  Filled 2012-11-19: qty 2

## 2012-11-19 MED ORDER — OXYCODONE-ACETAMINOPHEN 5-325 MG PO TABS: 1.0000 | ORAL_TABLET | ORAL | Status: DC | PRN

## 2012-11-19 NOTE — ED Notes (Signed)
Pt here 1 week ago after injuring left anterior ribs in a fall. States he was told the xray was neg for fx but pt states it has gotten worse and that he cannot control the pain. States is "hard to take a deep breath". Pt is in NAD.

## 2012-11-19 NOTE — ED Provider Notes (Signed)
History    This chart was scribed for non-physician practitioner working with [name], DO by [name], ED Scribe. This patient was seen in room TR09C/TR09C and the patient's care was started at 1346.   CSN: [id number]  Arrival date & time 11/19/12  1346   None     Chief Complaint  Patient presents with  . Rib Injury    The history is provided by the patient. No language interpreter was used.    Timothy Lowe is a 47 y.o. male who presents to the Emergency Department complaining of ongoing, constant, non-radiating, gradually worsening left anterior rib pain starting about 2 weeks ago after a fall. Pt states he fell from a height of 5 ft off of a bridge while hiking. Pt states he was evaluated here 1 week ago but was told the XRAY was negative for a rib fx. Pt states the pain has worsened and is unable to take deep breaths. States the pain is worsened by inhaling, exhaling, coughing, sneezing. Pt describes the pain as sharp and stabbing. He uses ibuprofen, tylenol, ultram, hot and cold compresses with minimal relief. According to pt, he cracked a rib on the right side about 3 years ago and states the pain is similar to that pain. Pt has h/o HTN, anxiety.   Pt denies smoking and alcohol use.    Past Medical History  Diagnosis Date  . GERD (gastroesophageal reflux disease)   . Depression   . Hypertension   . Hyperlipidemia   . Eczema   . Anxiety   . Insomnia   . Allergy     Past Surgical History  Procedure Laterality Date  . Hernia repair      Family History  Problem Relation Age of Onset  . Diabetes Sister     History  Substance Use Topics  . Smoking status: Never Smoker   . Smokeless tobacco: Former Neurosurgeon    Types: Chew  . Alcohol Use: No      Review of Systems  Constitutional: Negative for fever, diaphoresis, appetite change, fatigue and unexpected weight change.  HENT: Negative for mouth sores and neck stiffness.   Eyes: Negative for visual  disturbance.  Cardiovascular: Positive for chest pain (chest wall pain).  Gastrointestinal: Negative for nausea, vomiting, abdominal pain, diarrhea and constipation.  Endocrine: Negative for polydipsia, polyphagia and polyuria.  Genitourinary: Negative for dysuria, urgency, frequency and hematuria.  Musculoskeletal: Positive for arthralgias. Negative for back pain.  Skin: Negative for rash.  Allergic/Immunologic: Negative for immunocompromised state.  Neurological: Negative for syncope, light-headedness and headaches.  Hematological: Does not bruise/bleed easily.  Psychiatric/Behavioral: Negative for sleep disturbance. The patient is not nervous/anxious.     Allergies  Review of patient's allergies indicates no known allergies.  Home Medications   Current Outpatient Rx  Name  Route  Sig  Dispense  Refill  . Cyanocobalamin (VITAMIN B-12 PO)   Oral   Take 1 tablet by mouth daily.         Marland Kitchen FLUoxetine (PROZAC) 40 MG capsule   Oral   Take 1 capsule (40 mg total) by mouth daily.   30 capsule   11   . ibuprofen (ADVIL,MOTRIN) 600 MG tablet   Oral   Take 1 tablet (600 mg total) by mouth every 6 (six) hours as needed for pain.   20 tablet   0   . LORazepam (ATIVAN) 1 MG tablet   Oral   Take 1 tablet (1 mg total) by  mouth 2 (two) times daily as needed for anxiety.   60 tablet   5     The patient can refill the medication every 30 day ...   . losartan (COZAAR) 100 MG tablet   Oral   Take 1 tablet (100 mg total) by mouth daily.   30 tablet   11   . MELATONIN PO   Oral   Take 1 tablet by mouth at bedtime.          Marland Kitchen omeprazole (PRILOSEC) 20 MG capsule   Oral   Take 20 mg by mouth daily.         Marland Kitchen zolpidem (AMBIEN) 10 MG tablet   Oral   Take 1 tablet (10 mg total) by mouth at bedtime as needed for sleep.   30 tablet   5     Patient cannot fill this earlier than every 30 day ...   . oxyCODONE-acetaminophen (PERCOCET) 5-325 MG per tablet   Oral   Take 1  tablet by mouth every 4 (four) hours as needed for pain.   20 tablet   0     BP 158/99  Pulse 73  Temp(Src) 98.3 F (36.8 C)  Resp 18  SpO2 94%  Physical Exam  Nursing note and vitals reviewed. Constitutional: He is oriented to person, place, and time. He appears well-developed and well-nourished. No distress.  Pt tearful  HENT:  Head: Normocephalic and atraumatic.  Mouth/Throat: Oropharynx is clear and moist. No oropharyngeal exudate.  Eyes: Conjunctivae are normal. Pupils are equal, round, and reactive to light. No scleral icterus.  Neck: Normal range of motion. Neck supple.  Cardiovascular: Normal rate, regular rhythm, normal heart sounds and intact distal pulses.  Exam reveals no gallop and no friction rub.   No murmur heard. Pulmonary/Chest: Effort normal and breath sounds normal. No accessory muscle usage. Not tachypneic. No respiratory distress. He has no decreased breath sounds. He has no wheezes. He has no rhonchi. He has no rales. He exhibits tenderness and bony tenderness. He exhibits no laceration, no crepitus, no edema, no deformity, no swelling and no retraction.    Abdominal: Soft. Bowel sounds are normal. He exhibits no mass. There is no tenderness. There is no rebound and no guarding.  Musculoskeletal: Normal range of motion. He exhibits tenderness. He exhibits no edema.       Left shoulder: He exhibits normal range of motion, no tenderness, no bony tenderness, no swelling, no effusion, no crepitus, no deformity, no pain, no spasm and normal strength.  Tenderness to the left sided anterior chest. No bruising crepitus, deformity, or flail segment.   Lymphadenopathy:    He has no cervical adenopathy.  Neurological: He is alert and oriented to person, place, and time. He exhibits normal muscle tone. Coordination normal.  Speech is clear and goal oriented Moves extremities without ataxia  Skin: Skin is warm and dry. No rash noted. He is not diaphoretic. No erythema.   Psychiatric: He has a normal mood and affect.    ED Course  Procedures (including critical care time)  DIAGNOSTIC STUDIES: Oxygen Saturation is 94% on room air, adequate by my interpretation.    COORDINATION OF CARE: 3:28 PM-Discussed treatment plan with pt at bedside and pt agreed to plan.    Labs Reviewed - No data to display Dg Ribs Unilateral Left  11/19/2012  *RADIOLOGY REPORT*  Clinical Data: Fall.  Left upper anterior rib pain.  LEFT RIBS - 2 VIEW  Comparison: 11/11/2012  Findings:  Oblique views of the left ribs were obtained with radiopaque BB localizing the area of concern.  There is a nondisplaced acute fracture of the left anterolateral fifth rib. No evidence for left-sided pneumothorax or left pleural effusion.  IMPRESSION: Acute nondisplaced left fifth rib fracture.   Original Report Authenticated By: Kennith Center, M.D.      1. Rib fracture, left, with delayed healing, subsequent encounter   2. Chest wall pain       MDM  LAN MCNEILL is approximately 2 weeks after fall with increasing rib pain. Patient seen by primary care doctor the day after the fall without complaints of rib pain and he states that he took 2 days to develop. On record review, the patient was seen here and there was concern for drug-seeking behavior as patient's story at PCP and in the emergency room did not match.  Patient with significant pain to palpation of the left anterior ribs however is no bruising, deformity or crepitus noted. Will obtain dedicated rib films.  Rib films with acute nondisplaced left anterolateral fifth rib fracture no evidence of pneumothorax or pleural effusion. I personally reviewed the imaging tests through PACS system.  I reviewed available ER/hospitalization records through the EMR.  Discharge home with pain control and incentive spirometry. Also recommended patient followup with primary care or orthopedics for further evaluation.  I have also discussed reasons to return  immediately to the ER.  Patient expresses understanding and agrees with plan.   I personally performed the services described in this documentation, which was scribed in my presence. The recorded information has been reviewed and is accurate.   Dahlia Client Clavin Ruhlman, PA-C 11/19/12 1654

## 2012-11-19 NOTE — ED Notes (Signed)
Patient returned from XRay

## 2012-11-19 NOTE — ED Notes (Signed)
Patient transported to X-ray 

## 2012-11-19 NOTE — ED Notes (Signed)
Per pt sts came in a week ago with rib pain and sts has gotten worse. sts hurts to breathe. sts sharp stabbing.

## 2012-11-20 NOTE — ED Provider Notes (Signed)
Medical screening examination/treatment/procedure(s) were performed by non-physician practitioner and as supervising physician I was immediately available for consultation/collaboration.   Resean Brander M Eda Magnussen, DO 11/20/12 1535 

## 2012-11-23 ENCOUNTER — Ambulatory Visit (INDEPENDENT_AMBULATORY_CARE_PROVIDER_SITE_OTHER): Payer: 59 | Admitting: Family Medicine

## 2012-11-23 VITALS — BP 125/80 | HR 65 | Temp 98.5°F | Resp 18 | Ht 70.75 in | Wt 222.0 lb

## 2012-11-23 DIAGNOSIS — S2239XA Fracture of one rib, unspecified side, initial encounter for closed fracture: Secondary | ICD-10-CM

## 2012-11-23 DIAGNOSIS — S2232XA Fracture of one rib, left side, initial encounter for closed fracture: Secondary | ICD-10-CM

## 2012-11-23 MED ORDER — OXYCODONE-ACETAMINOPHEN 5-325 MG PO TABS: 1.0000 | ORAL_TABLET | ORAL | Status: DC | PRN

## 2012-11-23 NOTE — Patient Instructions (Addendum)
1. Call in one week for further medication; we will switch you to hydrocodone for further pain control.

## 2012-11-23 NOTE — Progress Notes (Signed)
[address]   [place], [place]  [address]   [phone]  Subjective:    Patient ID: Timothy Lowe, male    DOB: May 30, 1966, 47 y.o.   MRN: [id number]  HPI This 47 y.o. male presents for evaluation of L rib fracture.  Hiking and fell off of wall and landed in creek.  Evaluated in ED on 11/11/12; rib xrays negative.  Returned to ED on 11/19/12; repeat rib films revealed +L fifth rib fracture; prescribed Percocet 5/325 #20.  Taking one every four hours with good control of pain.  Now able to sleep; not sleeping in lazy boy.  Dealing with pollen and sneezing.  Currently between jobs so not having to be very physical.  No fever/chill/sweats.  Unable to take deep breath; mild cough.  Pt referred to ortho but prefers to receive pain management from PCP office.   Review of Systems  Constitutional: Negative for fever, chills, diaphoresis and fatigue.  HENT: Positive for congestion, rhinorrhea and sneezing.   Respiratory: Positive for cough. Negative for shortness of breath, wheezing and stridor.   Cardiovascular: Positive for chest pain. Negative for palpitations and leg swelling.  Musculoskeletal: Positive for myalgias and arthralgias. Negative for back pain.  Skin: Negative for wound.  Neurological: Negative for weakness and numbness.    Past Medical History  Diagnosis Date  . GERD (gastroesophageal reflux disease)   . Depression   . Hypertension   . Hyperlipidemia   . Eczema   . Anxiety   . Insomnia   . Allergy     Past Surgical History  Procedure Laterality Date  . Hernia repair       No Known Allergies    Family History  Problem Relation Age of Onset  . Diabetes Sister        Objective:   Physical Exam  Nursing note and vitals reviewed. Constitutional: He is oriented to person, place, and time. He appears well-developed and well-nourished. No distress.  HENT:  Head: Normocephalic and atraumatic.  Mouth/Throat: Oropharynx is clear and moist.  Eyes: Conjunctivae  and EOM are normal. Pupils are equal, round, and reactive to light.  Neck: Normal range of motion. Neck supple. No JVD present. No thyromegaly present.  Cardiovascular: Normal rate, regular rhythm, normal heart sounds and intact distal pulses.  Exam reveals no gallop and no friction rub.   No murmur heard. Pulmonary/Chest: Effort normal. No respiratory distress. He has no wheezes. He has no rales. He exhibits tenderness.  +TTP L anterior chest wall.  Musculoskeletal: He exhibits tenderness.  +TTP L ANTERIOR CHEST WALL.  Lymphadenopathy:    He has no cervical adenopathy.  Neurological: He is alert and oriented to person, place, and time.  Skin: Skin is warm and dry. No rash noted. He is not diaphoretic. No erythema.  Psychiatric: He has a normal mood and affect. His behavior is normal. Judgment and thought content normal.    ED RECORDS FROM 11/2012 REVIEWED IN DETAIL.      Assessment & Plan:  Rib fracture, left, closed, initial encounter - Plan: oxyCODONE-acetaminophen (PERCOCET) 5-325 MG per tablet    1.  Rib fracture L initial encounter:  New to this provider.  Injury two weeks ago.  Rx for Oxycodone 5/325 one every four hours #42 no refills; rx will last one week.  Pt advised to call office in one week for rx for Hydrocodone.  Encourage pt to take several deep breaths throughout day after taking oxycodone.  RTC for fever, cough,  SOB.  Avoid lifting for the next 2-3 weeks.  Meds ordered this encounter  Medications  . FLUoxetine (PROZAC) 40 MG capsule    Sig: Take 60 mg by mouth daily.  Marland Kitchen oxyCODONE-acetaminophen (PERCOCET) 5-325 MG per tablet    Sig: Take 1 tablet by mouth every 4 (four) hours as needed for pain.    Dispense:  42 tablet    Refill:  0    Order Specific Question:  Supervising Provider    Answer:  Eber Hong D [[id number]]

## 2012-11-26 ENCOUNTER — Telehealth: Payer: Self-pay

## 2012-11-26 NOTE — Telephone Encounter (Signed)
Patient is feeling about the same. His concern is that the medication runs out on Sunday and Dr. Katrinka Blazing will not be in the office to send in the Hydrocodone. Reminded pt of limiting his activity level and to rest. He reports he has been. He will call back around Sunday or come in if pain is not significantly better.

## 2012-11-26 NOTE — Telephone Encounter (Signed)
Needs to continue current medication as prescribed.  He also needs to limit activity level.  He should be resting and ambulating short distances at this time.  No lifting at all.  Pain will slowly improve over the next several days.

## 2012-11-26 NOTE — Telephone Encounter (Signed)
Pt was seen this past Sunday and seen Dr. Katrinka Blazing. He came in with a broken rib. He was told to call back if his pain wasn't getting any better. The pain is server. And he is wanting to know what he needs to do.  [facility] back number is [phone]

## 2012-11-27 ENCOUNTER — Telehealth: Payer: Self-pay

## 2012-11-27 NOTE — Telephone Encounter (Signed)
Patient was in last week for a broken rib with dr Katrinka Blazing, she told him she would lessen the pain medication this week to hydrocodone instead of oxycodone please call patient at [phone]

## 2012-11-27 NOTE — Telephone Encounter (Signed)
Patient requesting Hydrocodone, please advise.

## 2012-11-27 NOTE — Telephone Encounter (Signed)
Patient called in this evening to find out why his pain medication has not been called in yet. Per chart notes patient was to call in on Sunday if he was not doing any better. Patient states that he is going out of town this weekend and wants the medication now to bring with him. He does not anticipate that he will be feeling better by then. He is not finding relief with the oxycodone. Please advise if we can call this in for him.

## 2012-11-27 NOTE — Telephone Encounter (Signed)
Patient called back wanting hydrocodone called in to his pharmacy this evening. I spoke with Dr. Katrinka Blazing- the medication will not be called in until Sunday. If he would like we can call it into a local pharmacy to where he is traveling. Pt was upset but understood.

## 2012-11-28 ENCOUNTER — Telehealth: Payer: Self-pay

## 2012-11-28 NOTE — Telephone Encounter (Signed)
Thanks, called him back, he is advised. He will call on Sunday for the Rx.

## 2012-11-28 NOTE — Telephone Encounter (Signed)
Left message for return call.

## 2012-11-28 NOTE — Telephone Encounter (Signed)
Pt is getting treated by Dr. Katrinka Blazing for a Rib injury and he is needing a prescription is all he said. But he is going out of town this weekend and doesn't know what city he will be in And was wanting to know if something could be called in before he left Call back number [phone]

## 2012-11-28 NOTE — Telephone Encounter (Signed)
Pt is calling back call back number is [phone]

## 2012-11-28 NOTE — Telephone Encounter (Signed)
Patient has called multiple times about this, what should I advise him, he is out of the oxycodone given 4/20.

## 2012-11-28 NOTE — Telephone Encounter (Signed)
At his visit over the weekend, he reported that his pain was well controlled on Oxycodone one every four hours; I provided him with rx for 42 tablets to last one week.  Thus, he has been overusing the Oxycodone. His pain should not be worsening three weeks after a rib fracture; if it is, he needs to return to clinic for reevaluation.    He has called multiple times this week regarding pain medication, which concerns me.  He was advised last night that we will call in rx for hydrocodone to his out of town pharmacy on Sunday; he needs to call on Sunday with pharmacy name and number.

## 2012-11-30 ENCOUNTER — Telehealth: Payer: Self-pay

## 2012-11-30 MED ORDER — HYDROCODONE-ACETAMINOPHEN 7.5-325 MG PO TABS: 1.0000 | ORAL_TABLET | ORAL | Status: DC | PRN

## 2012-11-30 NOTE — Telephone Encounter (Signed)
Patient would like a call back he would like a refill on the following medication: oxycodone. He also states he has a few questions to ask either the nurse or provider. Please call him back at [phone]. Thanks

## 2012-11-30 NOTE — Telephone Encounter (Signed)
Patient would like something for pain without acetaminophen because he is worried about taking too much. He still is in a lot of pain. Pharmacy closes at 5pm.

## 2012-11-30 NOTE — Telephone Encounter (Signed)
SEE PREVIOUS MESSAGE. PATIENT WOULD LIKE TO UPDATE SOME INFO ON THE MEDICATION THAT IS TO BE CALLED IN TO THE PHARMACY. [phone]

## 2012-11-30 NOTE — Telephone Encounter (Signed)
Patient has called again for his pain medication. Same message as below: Patient would like a call back he would like a refill on the following medication: oxycodone. He also states he has a few questions to ask either the nurse or provider. Please call him back at [phone]. Thanks   Patient hung up and angrily says he "guesses he will have to stand at [facility] to get his rx refill."

## 2012-11-30 NOTE — Telephone Encounter (Signed)
Called in rx for hydrocodone 7.5/325 to [facility] around 440pm. Patient had called 2 more times for status of rx. He spoke with [employer] and [employer] and was very ugly, rude, and cursing. I was able to take his call the third time he called. I advised him rx was called in and should last a full week. If pain still persists during this coarse of medication, after the week of this coarse, or if needs refill then he will have to come in for follow up. Refill will not be given without follow up.  I also advised not appropriate to speak to and treat the staff the way he has. He stated he has not been rude and does not cuss.

## 2012-11-30 NOTE — Telephone Encounter (Signed)
Patient called and would like his medication called into pharmacy today.  He stated that he has been trying since Friday to get refill.

## 2012-11-30 NOTE — Telephone Encounter (Signed)
Last OV and phone messages reviewed. Rx printed for Hydrocodone 7.5/325, 1 PO Q4 hours, #42 (7 day supply), NO REFILLS. If pain is worsening, or not controlled on this, he must RTC.

## 2012-11-30 NOTE — Telephone Encounter (Signed)
Returned call. His concern was taking the tylenol dose he has been taking for weeks and if he should be concerned about his liver? Also requesting RX for hydrocodone. Please advise  Karin [facility]

## 2013-01-20 ENCOUNTER — Telehealth: Payer: Self-pay

## 2013-01-20 NOTE — Telephone Encounter (Signed)
Pharmacy called stating that patient is requesting an early refill on Lorazapam. States that patient got it on the first of the month and is already out. Please call pharmacy at [phone]

## 2013-01-21 NOTE — Telephone Encounter (Signed)
I have sent denial to pharmacy.

## 2013-01-21 NOTE — Telephone Encounter (Signed)
Please advise 

## 2013-01-21 NOTE — Telephone Encounter (Signed)
There is a note in the record it says he is only allowed to fill his medications once a month. He cannot fill it early.

## 2013-02-08 ENCOUNTER — Ambulatory Visit (INDEPENDENT_AMBULATORY_CARE_PROVIDER_SITE_OTHER): Payer: Self-pay | Admitting: Family Medicine

## 2013-02-08 VITALS — BP 122/78 | HR 63 | Temp 98.4°F | Resp 12 | Ht 71.0 in | Wt 219.0 lb

## 2013-02-08 DIAGNOSIS — F411 Generalized anxiety disorder: Secondary | ICD-10-CM

## 2013-02-08 DIAGNOSIS — I10 Essential (primary) hypertension: Secondary | ICD-10-CM

## 2013-02-08 DIAGNOSIS — G4700 Insomnia, unspecified: Secondary | ICD-10-CM

## 2013-02-08 DIAGNOSIS — F32A Depression, unspecified: Secondary | ICD-10-CM

## 2013-02-08 DIAGNOSIS — F329 Major depressive disorder, single episode, unspecified: Secondary | ICD-10-CM

## 2013-02-08 MED ORDER — LOSARTAN POTASSIUM 100 MG PO TABS: 100.0000 mg | ORAL_TABLET | Freq: Every day | ORAL | Status: DC

## 2013-02-08 MED ORDER — LORAZEPAM 1 MG PO TABS: 1.0000 mg | ORAL_TABLET | Freq: Two times a day (BID) | ORAL | Status: DC | PRN

## 2013-02-08 MED ORDER — FLUOXETINE HCL 20 MG PO TABS: ORAL_TABLET | ORAL | Status: DC

## 2013-02-08 MED ORDER — ZOLPIDEM TARTRATE 10 MG PO TABS: 10.0000 mg | ORAL_TABLET | Freq: Every evening | ORAL | Status: DC | PRN

## 2013-02-08 NOTE — Progress Notes (Signed)
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  His unemployment was stressful.  Still living at [facility] house, sleeping on the couch which is stressful.  Getting  Therapy.  Objective:  NAD, alert and appropriate  Chest:  Clear Heart:  Reg, no murmur  Assessment:  Stable  Insomnia - Plan: zolpidem (AMBIEN) 10 MG tablet  Unspecified essential hypertension - Plan: losartan (COZAAR) 100 MG tablet  Anxiety state, unspecified - Plan: LORazepam (ATIVAN) 1 MG tablet  Depression - Plan: FLUoxetine (PROZAC) 20 MG tablet  Signed, Elvina Sidle, MD

## 2013-02-08 NOTE — Patient Instructions (Addendum)

## 2013-03-01 ENCOUNTER — Telehealth: Payer: Self-pay

## 2013-03-01 NOTE — Telephone Encounter (Signed)
Is this ok?

## 2013-03-01 NOTE — Telephone Encounter (Signed)
Patient states he left his Zolpidem meds at a friends house. He would like to get a 4 day early refill on the medication. He can be reached at [phone].  Thanks

## 2013-03-02 NOTE — Telephone Encounter (Signed)
Per Dr. Katrinka Blazing, she is NOT this patient's PCP, though she is listed as such.  Forward to Dr. Milus Glazier, who last filled this medication.

## 2013-03-02 NOTE — Telephone Encounter (Signed)
Per Dr Loma Boston last note these can not be filled early so he will either get it from his friends house or wait.

## 2013-03-03 ENCOUNTER — Telehealth: Payer: Self-pay

## 2013-03-03 NOTE — Telephone Encounter (Signed)
Called patient, left message.

## 2013-03-03 NOTE — Telephone Encounter (Signed)
Patient called to follow up on previous request, passed along message from Maralyn Sago that zolpidem could not be refilled early.

## 2013-06-12 ENCOUNTER — Ambulatory Visit: Payer: Self-pay | Admitting: Emergency Medicine

## 2013-06-12 VITALS — BP 130/90 | HR 67 | Temp 97.9°F | Resp 16 | Ht 70.25 in | Wt 234.0 lb

## 2013-06-12 DIAGNOSIS — F329 Major depressive disorder, single episode, unspecified: Secondary | ICD-10-CM

## 2013-06-12 DIAGNOSIS — H6120 Impacted cerumen, unspecified ear: Secondary | ICD-10-CM

## 2013-06-12 DIAGNOSIS — F411 Generalized anxiety disorder: Secondary | ICD-10-CM

## 2013-06-12 DIAGNOSIS — I10 Essential (primary) hypertension: Secondary | ICD-10-CM

## 2013-06-12 DIAGNOSIS — H6121 Impacted cerumen, right ear: Secondary | ICD-10-CM

## 2013-06-12 DIAGNOSIS — F3289 Other specified depressive episodes: Secondary | ICD-10-CM

## 2013-06-12 DIAGNOSIS — G4700 Insomnia, unspecified: Secondary | ICD-10-CM

## 2013-06-12 MED ORDER — VITAMIN B-12 1000 MCG PO TABS: 1000.0000 ug | ORAL_TABLET | Freq: Every day | ORAL | Status: DC

## 2013-06-12 MED ORDER — LORAZEPAM 1 MG PO TABS: 1.0000 mg | ORAL_TABLET | Freq: Two times a day (BID) | ORAL | Status: DC | PRN

## 2013-06-12 MED ORDER — LOSARTAN POTASSIUM 100 MG PO TABS: 100.0000 mg | ORAL_TABLET | Freq: Every day | ORAL | Status: DC

## 2013-06-12 MED ORDER — FLUOXETINE HCL 20 MG PO TABS: ORAL_TABLET | ORAL | Status: DC

## 2013-06-12 MED ORDER — ZOLPIDEM TARTRATE 10 MG PO TABS: 10.0000 mg | ORAL_TABLET | Freq: Every evening | ORAL | Status: DC | PRN

## 2013-06-12 NOTE — Progress Notes (Signed)
[facility] [address] [place] [address] [phone]  Date:  06/12/2013   Name:  Timothy Lowe   DOB:  Jul 20, 1966   MRN:  [id number]  PCP:  Nilda Simmer, MD    Chief Complaint: Medication Refill   History of Present Illness:  Timothy Lowe is a 47 y.o. very pleasant male patient who presents with the following:  History of anxiety and depression and insomnia.  In between jobs.  Lives with his mother.  Laid off last week.  Stopped up right ear.  No coryza or cough or fever.  No improvement with over the counter medications or other home remedies. Denies other complaint or health concern today.   Patient Active Problem List   Diagnosis Date Noted  . Chronic cough 12/28/2010  . Nonallopathic lesion 11/01/2010  . Eczema, dyshidrotic 11/01/2010  . INSOMNIA 07/13/2010  . MUSCLE SPASM, BACK 06/06/2010  . HYPERTENSION, BENIGN ESSENTIAL 09/12/2009  . DYSLIPIDEMIA 08/23/2009  . HYPERGLYCEMIA, FASTING 08/23/2009  . ANXIETY DEPRESSION 07/27/2009  . GERD 07/27/2009    Past Medical History  Diagnosis Date  . GERD (gastroesophageal reflux disease)   . Depression   . Hypertension   . Hyperlipidemia   . Eczema   . Anxiety   . Insomnia   . Allergy     Past Surgical History  Procedure Laterality Date  . Hernia repair      History  Substance Use Topics  . Smoking status: Never Smoker   . Smokeless tobacco: Former Neurosurgeon    Types: Chew  . Alcohol Use: No    Family History  Problem Relation Age of Onset  . Diabetes Sister     No Known Allergies  Medication list has been reviewed and updated.  Current Outpatient Prescriptions on File Prior to Visit  Medication Sig Dispense Refill  . Cyanocobalamin (VITAMIN B-12 PO) Take 1 tablet by mouth daily.      Marland Kitchen FLUoxetine (PROZAC) 20 MG tablet Three tablets daily  90 tablet  11  . ibuprofen (ADVIL,MOTRIN) 600 MG tablet Take 1 tablet (600 mg total) by mouth every 6 (six) hours as needed for pain.   20 tablet  0  . LORazepam (ATIVAN) 1 MG tablet Take 1 tablet (1 mg total) by mouth 2 (two) times daily as needed for anxiety.  60 tablet  5  . losartan (COZAAR) 100 MG tablet Take 1 tablet (100 mg total) by mouth daily.  30 tablet  11  . omeprazole (PRILOSEC) 20 MG capsule Take 20 mg by mouth daily.      Marland Kitchen zolpidem (AMBIEN) 10 MG tablet Take 1 tablet (10 mg total) by mouth at bedtime as needed for sleep.  30 tablet  5   No current facility-administered medications on file prior to visit.    Review of Systems:  As per HPI, otherwise negative.    Physical Examination: Filed Vitals:   06/12/13 1023  BP: 130/90  Pulse: 67  Temp: 97.9 F (36.6 C)  Resp: 16   Filed Vitals:   06/12/13 1023  Height: 5' 10.25" (1.784 m)  Weight: 234 lb (106.142 kg)   Body mass index is 33.35 kg/(m^2). Ideal Body Weight: Weight in (lb) to have BMI = 25: 175.1  GEN: WDWN, NAD, Non-toxic, A & O x 3 HEENT: Atraumatic, Normocephalic. Neck supple. No masses, No LAD. Ears and Nose: No external deformity.  Right cerumen impaction CV: RRR, No M/G/R. No JVD. No thrill. No extra heart sounds.  PULM: CTA B, no wheezes, crackles, rhonchi. No retractions. No resp. distress. No accessory muscle use. ABD: S, NT, ND, +BS. No rebound. No HSM. EXTR: No c/c/e NEURO Normal gait.  PSYCH: Normally interactive. Conversant. Not depressed or anxious appearing.  Calm demeanor.    Assessment and Plan: Anxiety and depression Insomnia Hypertension Cerumen impaction   Signed,  Phillips Odor, MD

## 2013-08-04 ENCOUNTER — Telehealth: Payer: Self-pay

## 2013-08-04 ENCOUNTER — Other Ambulatory Visit: Payer: Self-pay | Admitting: Emergency Medicine

## 2013-08-04 ENCOUNTER — Other Ambulatory Visit: Payer: Self-pay | Admitting: Radiology

## 2013-08-04 MED ORDER — TEMAZEPAM 30 MG PO CAPS: 30.0000 mg | ORAL_CAPSULE | Freq: Every evening | ORAL | Status: DC | PRN

## 2013-08-04 NOTE — Telephone Encounter (Signed)
D/c remaining Ambien sent restoril

## 2013-08-04 NOTE — Telephone Encounter (Signed)
Done

## 2013-08-04 NOTE — Telephone Encounter (Signed)
Pt states that the Ambien is that he has been taking is no longer working for him anymore, pt would like to try Restoril instead because that has worked for him in the past. Best# [phone]

## 2013-08-07 ENCOUNTER — Telehealth: Payer: Self-pay

## 2013-08-07 NOTE — Telephone Encounter (Signed)
Patient says his blood pressure is running a little high on losarten and would like to switch from losarten- current bp medication to his previous bp medication called amalopedin. Please advise.   Best: [phone]  pharmacy: [address] [place] - [id number]

## 2013-08-07 NOTE — Telephone Encounter (Signed)
He wants you to change meds from Losartan to Amlodipine please advise.

## 2013-08-08 NOTE — Telephone Encounter (Signed)
Not over the phone.  We need to verify his blood pressure

## 2013-08-09 NOTE — Telephone Encounter (Signed)
Patient called to check on status of rx. I informed patient Dr. Dareen PianoAnderson would like to verify his BP before changing medication.

## 2013-08-10 NOTE — Telephone Encounter (Signed)
Thank you :)

## 2013-08-27 ENCOUNTER — Other Ambulatory Visit: Payer: Self-pay

## 2013-08-27 MED ORDER — TEMAZEPAM 30 MG PO CAPS: 30.0000 mg | ORAL_CAPSULE | Freq: Every evening | ORAL | Status: DC | PRN

## 2013-08-27 NOTE — Telephone Encounter (Signed)
We rx'd restoril for pt to try for one month instead of Ambien. He likes this med and has contacted his pharmacy a few days ago for a refill but has not heard back. Refill is due 1/26 and pt does not want to go without.  [phone]

## 2013-08-27 NOTE — Telephone Encounter (Signed)
Dr Dareen PianoAnderson, I have pended Rx for your review. Didn't know if you wanted any add'l RFs on it.

## 2013-08-28 NOTE — Telephone Encounter (Signed)
Faxed and pt notified on VM. 

## 2013-09-16 ENCOUNTER — Other Ambulatory Visit: Payer: Self-pay

## 2013-09-16 MED ORDER — ZOLPIDEM TARTRATE 10 MG PO TABS: 10.0000 mg | ORAL_TABLET | Freq: Every evening | ORAL | Status: AC | PRN

## 2013-09-16 NOTE — Telephone Encounter (Signed)
Pt called and stated that he would like to change back to zolpidem 10 mg for insomnia instead of the temazepam because he found out that the generic the pharmacy he normally uses for zolpidem is not as effective for many people. He would like a new Rx to be sent in to the [facility] on [address] which carries a more effective generic for zolpidem. He got his last Rf of temazepam on 08/27/13 so not quite due yet but didn't want to wait until last minute. Pt plans to RTC in April for his regular f/up. Dr Dareen PianoAnderson, do you want to send in a new Rx to be filled on 09/26/13 or have pt CB to request next week? Pended for review.

## 2013-09-17 ENCOUNTER — Telehealth: Payer: Self-pay

## 2013-09-17 NOTE — Telephone Encounter (Signed)
Patient calling to get his medication refilled for Timothy Lofflerambien but his pharmacy says they never received it can someone look into this? I see its pended but not sure why whether Dr. Dareen PianoAnderson needs to approve it or if he needs an office visit. Please advise and confirm with patient that he will get his refill on it. Thank you!  [phone]

## 2013-09-17 NOTE — Telephone Encounter (Signed)
Notified pt sent in to [facility].

## 2013-09-18 NOTE — Telephone Encounter (Signed)
Rx had been approved on 09/16/13, but I called it in again to make sure they had received it. Notified pt on VM.

## 2013-10-06 ENCOUNTER — Other Ambulatory Visit: Payer: Self-pay

## 2013-10-06 ENCOUNTER — Telehealth: Payer: Self-pay

## 2013-10-06 DIAGNOSIS — I10 Essential (primary) hypertension: Secondary | ICD-10-CM

## 2013-10-06 MED ORDER — LOSARTAN POTASSIUM 100 MG PO TABS: 100.0000 mg | ORAL_TABLET | Freq: Every day | ORAL | Status: DC

## 2013-10-06 NOTE — Telephone Encounter (Signed)
Received req for RF of Ativan from pharm. Called [facility] and spoke w/pharm who stated that he had denied service to pt anymore due to constant problems w/cont subst. He checked computer and found that pt has been coming in when he was not there and other pharmacists have filled Ativan - all RFs from 06/12/13 already filled despite note from Dr Dareen PianoAnderson on Rx to not fill more often than Q 30 days. Pharmacist was upset this had occurred and stated that he is putting COMMENT in their system not to fill any Rxs for pt in future. Dr Dareen PianoAnderson, [place] Childes[place]I.

## 2013-10-25 ENCOUNTER — Telehealth: Payer: Self-pay

## 2013-10-25 DIAGNOSIS — F329 Major depressive disorder, single episode, unspecified: Secondary | ICD-10-CM

## 2013-10-25 DIAGNOSIS — F32A Depression, unspecified: Secondary | ICD-10-CM

## 2013-10-25 DIAGNOSIS — I10 Essential (primary) hypertension: Secondary | ICD-10-CM

## 2013-10-25 MED ORDER — VITAMIN B-12 1000 MCG PO TABS
1000.0000 ug | ORAL_TABLET | Freq: Every day | ORAL | Status: DC
Start: 2013-10-25 — End: 2014-04-11

## 2013-10-25 MED ORDER — FLUOXETINE HCL 20 MG PO TABS: ORAL_TABLET | ORAL | Status: DC

## 2013-10-25 MED ORDER — LOSARTAN POTASSIUM 100 MG PO TABS: 100.0000 mg | ORAL_TABLET | Freq: Every day | ORAL | Status: DC

## 2013-10-25 NOTE — Telephone Encounter (Signed)
Patient will be employed starting tomorrow.  He is requesting one more month refills on ALL of his medications, so he can SAVE enough money for an OV.   [facility]Harris [facility]eter at [facility]   Please call either way -  [phone]

## 2013-10-25 NOTE — Telephone Encounter (Signed)
I have sent a 30 day supply of all non-controlled substances.  Pt will need OV to have those prescriptions written.  See telephone note dated 10/06/13 re: [facility]

## 2013-10-26 NOTE — Telephone Encounter (Signed)
PT CALLED AND WANTED A CALL BACK FROM ANYONE WHO IS DOING THE PRESCRIPTIONS, WASN'T ABLE TO GET ANY MORE INFORMATION FROM HIM PLEASE CALL [phone]

## 2013-10-27 NOTE — Telephone Encounter (Signed)
Spoke to pt, stated he had no more rf, called [facility] pharm, med was filled on 06/12/13, 06/24/13, 08/08/13, 09/12/13, 10/04/13.   I explained to him he would need an OV for this medication to be refilled at this time because he has been refilling this medication early every time. He stated "well the pharmacist filled it"  Pt was very upset.

## 2013-10-30 ENCOUNTER — Ambulatory Visit: Payer: Self-pay | Admitting: Emergency Medicine

## 2013-10-30 VITALS — BP 116/90 | HR 69 | Temp 97.9°F | Resp 16 | Ht 70.5 in | Wt 249.2 lb

## 2013-10-30 DIAGNOSIS — F329 Major depressive disorder, single episode, unspecified: Secondary | ICD-10-CM

## 2013-10-30 DIAGNOSIS — I10 Essential (primary) hypertension: Secondary | ICD-10-CM

## 2013-10-30 DIAGNOSIS — R7309 Other abnormal glucose: Secondary | ICD-10-CM

## 2013-10-30 DIAGNOSIS — F3289 Other specified depressive episodes: Secondary | ICD-10-CM

## 2013-10-30 DIAGNOSIS — F32A Depression, unspecified: Secondary | ICD-10-CM

## 2013-10-30 DIAGNOSIS — R739 Hyperglycemia, unspecified: Secondary | ICD-10-CM

## 2013-10-30 DIAGNOSIS — F411 Generalized anxiety disorder: Secondary | ICD-10-CM

## 2013-10-30 MED ORDER — ZOLPIDEM TARTRATE 10 MG PO TABS: 10.0000 mg | ORAL_TABLET | Freq: Every evening | ORAL | Status: AC | PRN

## 2013-10-30 MED ORDER — LORAZEPAM 1 MG PO TABS: 1.0000 mg | ORAL_TABLET | Freq: Two times a day (BID) | ORAL | Status: DC | PRN

## 2013-10-30 MED ORDER — FLUOXETINE HCL 20 MG PO TABS: ORAL_TABLET | ORAL | Status: DC

## 2013-10-30 MED ORDER — OMEPRAZOLE 20 MG PO CPDR: 20.0000 mg | DELAYED_RELEASE_CAPSULE | Freq: Every day | ORAL | Status: DC

## 2013-10-30 MED ORDER — LOSARTAN POTASSIUM-HCTZ 100-12.5 MG PO TABS: 1.0000 | ORAL_TABLET | Freq: Every day | ORAL | Status: DC

## 2013-10-30 NOTE — Progress Notes (Signed)
[facility] [address], [place] [address] [phone]  Date:  10/30/2013   Name:  Timothy Lowe   DOB:  [birth year]   MRN:  [id number]  PCP:  Nilda Simmer, MD    Chief Complaint: Medication Refill   History of Present Illness:  Timothy Lowe is a 48 y.o. very pleasant male patient who presents with the following:  Comes in for refills.  Has been checking his BP on his own and is concerned about the readings over 100 diastolic.  Has not had labs in over one year and is not fasting today.  Had markedly elevated LDL and history of elevated sugars with a normal A1C.  No improvement with over the counter medications or other home remedies. Denies other complaint or health concern today.   Patient Active Problem List   Diagnosis Date Noted  . Chronic cough 12/28/2010  . Nonallopathic lesion 11/01/2010  . Eczema, dyshidrotic 11/01/2010  . INSOMNIA 07/13/2010  . MUSCLE SPASM, BACK 06/06/2010  . HYPERTENSION, BENIGN ESSENTIAL 09/12/2009  . DYSLIPIDEMIA 08/23/2009  . HYPERGLYCEMIA, FASTING 08/23/2009  . ANXIETY DEPRESSION 07/27/2009  . GERD 07/27/2009    Past Medical History  Diagnosis Date  . GERD (gastroesophageal reflux disease)   . Depression   . Hypertension   . Hyperlipidemia   . Eczema   . Anxiety   . Insomnia   . Allergy     Past Surgical History  Procedure Laterality Date  . Hernia repair      History  Substance Use Topics  . Smoking status: Never Smoker   . Smokeless tobacco: Former Neurosurgeon    Types: Chew  . Alcohol Use: No    Family History  Problem Relation Age of Onset  . Diabetes Sister     No Known Allergies  Medication list has been reviewed and updated.  Current Outpatient Prescriptions on File Prior to Visit  Medication Sig Dispense Refill  . FLUoxetine (PROZAC) 20 MG tablet Three tablets daily  90 tablet  0  . ibuprofen (ADVIL,MOTRIN) 600 MG tablet Take 1 tablet (600 mg total) by mouth every 6 (six) hours as  needed for pain.  20 tablet  0  . LORazepam (ATIVAN) 1 MG tablet Take 1 tablet (1 mg total) by mouth 2 (two) times daily as needed for anxiety.  60 tablet  5  . losartan (COZAAR) 100 MG tablet Take 1 tablet (100 mg total) by mouth daily.  30 tablet  0  . omeprazole (PRILOSEC) 20 MG capsule Take 20 mg by mouth daily.      . temazepam (RESTORIL) 30 MG capsule Take 1 capsule (30 mg total) by mouth at bedtime as needed for sleep.  30 capsule  0  . vitamin B-12 (CYANOCOBALAMIN) 1000 MCG tablet Take 1 tablet (1,000 mcg total) by mouth daily.  30 tablet  0   No current facility-administered medications on file prior to visit.    Review of Systems:  As per HPI, otherwise negative.    Physical Examination: Filed Vitals:   10/30/13 1333  BP: 116/90  Pulse: 69  Temp: 97.9 F (36.6 C)  Resp: 16   Filed Vitals:   10/30/13 1333  Height: 5' 10.5" (1.791 m)  Weight: 249 lb 3.2 oz (113.036 kg)   Body mass index is 35.24 kg/(m^2). Ideal Body Weight: Weight in (lb) to have BMI = 25: 176.4  GEN: obese, NAD, Non-toxic, A & O x 3 HEENT: Atraumatic, Normocephalic. Neck supple. No  masses, No LAD. Ears and Nose: No external deformity. CV: RRR, No M/G/R. No JVD. No thrill. No extra heart sounds. PULM: CTA B, no wheezes, crackles, rhonchi. No retractions. No resp. distress. No accessory muscle use. ABD: S, NT, ND, +BS. No rebound. No HSM. EXTR: No c/c/e NEURO Normal gait.  PSYCH: Normally interactive. Conversant. Not depressed or anxious appearing.  Calm demeanor.    Assessment and Plan: Hypertension HLD Obesity  Signed,  Phillips OdorJeffery Eugen Jeansonne, MD

## 2013-10-30 NOTE — Patient Instructions (Signed)
Type 2 Diabetes Mellitus, Adult Type 2 diabetes mellitus, often simply referred to as type 2 diabetes, is a long-lasting (chronic) disease. In type 2 diabetes, the pancreas does not make enough insulin (a hormone), the cells are less responsive to the insulin that is made (insulin resistance), or both. Normally, insulin moves sugars from food into the tissue cells. The tissue cells use the sugars for energy. The lack of insulin or the lack of normal response to insulin causes excess sugars to build up in the blood instead of going into the tissue cells. As a result, high blood sugar (hyperglycemia) develops. The effect of high sugar (glucose) levels can cause many complications. Type 2 diabetes was also previously called adult-onset diabetes but it can occur at any age.  RISK FACTORS  A person is predisposed to developing type 2 diabetes if someone in the family has the disease and also has one or more of the following primary risk factors:  Overweight.  An inactive lifestyle.  A history of consistently eating high-calorie foods. Maintaining a normal weight and regular physical activity can reduce the chance of developing type 2 diabetes. SYMPTOMS  A person with type 2 diabetes may not show symptoms initially. The symptoms of type 2 diabetes appear slowly. The symptoms include:  Increased thirst (polydipsia).  Increased urination (polyuria).  Increased urination during the night (nocturia).  Weight loss. This weight loss may be rapid.  Frequent, recurring infections.  Tiredness (fatigue).  Weakness.  Vision changes, such as blurred vision.  Fruity smell to your breath.  Abdominal pain.  Nausea or vomiting.  Cuts or bruises which are slow to heal.  Tingling or numbness in the hands or feet. DIAGNOSIS Type 2 diabetes is frequently not diagnosed until complications of diabetes are present. Type 2 diabetes is diagnosed when symptoms or complications are present and when blood  glucose levels are increased. Your blood glucose level may be checked by one or more of the following blood tests:  A fasting blood glucose test. You will not be allowed to eat for at least 8 hours before a blood sample is taken.  A random blood glucose test. Your blood glucose is checked at any time of the day regardless of when you ate.  A hemoglobin A1c blood glucose test. A hemoglobin A1c test provides information about blood glucose control over the previous 3 months.  An oral glucose tolerance test (OGTT). Your blood glucose is measured after you have not eaten (fasted) for 2 hours and then after you drink a glucose-containing beverage. TREATMENT   You may need to take insulin or diabetes medicine daily to keep blood glucose levels in the desired range.  You will need to match insulin dosing with exercise and healthy food choices. The treatment goal is to maintain the before meal blood sugar (preprandial glucose) level at 70 130 mg/dL. HOME CARE INSTRUCTIONS   Have your hemoglobin A1c level checked twice a year.  Perform daily blood glucose monitoring as directed by your caregiver.  Monitor urine ketones when you are ill and as directed by your caregiver.  Take your diabetes medicine or insulin as directed by your caregiver to maintain your blood glucose levels in the desired range.  Never run out of diabetes medicine or insulin. It is needed every day.  Adjust insulin based on your intake of carbohydrates. Carbohydrates can raise blood glucose levels but need to be included in your diet. Carbohydrates provide vitamins, minerals, and fiber which are an essential part of   a healthy diet. Carbohydrates are found in fruits, vegetables, whole grains, dairy products, legumes, and foods containing added sugars.    Eat healthy foods. Alternate 3 meals with 3 snacks.  Lose weight if overweight.  Carry a medical alert card or wear your medical alert jewelry.  Carry a 15 gram  carbohydrate snack with you at all times to treat low blood glucose (hypoglycemia). Some examples of 15 gram carbohydrate snacks include:  Glucose tablets, 3 or 4   Glucose gel, 15 gram tube  Raisins, 2 tablespoons (24 grams)  Jelly beans, 6  Animal crackers, 8  Regular pop, 4 ounces (120 mL)  Gummy treats, 9  Recognize hypoglycemia. Hypoglycemia occurs with blood glucose levels of 70 mg/dL and below. The risk for hypoglycemia increases when fasting or skipping meals, during or after intense exercise, and during sleep. Hypoglycemia symptoms can include:  Tremors or shakes.  Decreased ability to concentrate.  Sweating.  Increased heart rate.  Headache.  Dry mouth.  Hunger.  Irritability.  Anxiety.  Restless sleep.  Altered speech or coordination.  Confusion.  Treat hypoglycemia promptly. If you are alert and able to safely swallow, follow the 15:15 rule:  Take 15 20 grams of rapid-acting glucose or carbohydrate. Rapid-acting options include glucose gel, glucose tablets, or 4 ounces (120 mL) of fruit juice, regular soda, or low fat milk.  Check your blood glucose level 15 minutes after taking the glucose.  Take 15 20 grams more of glucose if the repeat blood glucose level is still 70 mg/dL or below.  Eat a meal or snack within 1 hour once blood glucose levels return to normal.    Be alert to polyuria and polydipsia which are early signs of hyperglycemia. An early awareness of hyperglycemia allows for prompt treatment. Treat hyperglycemia as directed by your caregiver.  Engage in at least 150 minutes of moderate-intensity physical activity a week, spread over at least 3 days of the week or as directed by your caregiver. In addition, you should engage in resistance exercise at least 2 times a week or as directed by your caregiver.  Adjust your medicine and food intake as needed if you start a new exercise or sport.  Follow your sick day plan at any time you  are unable to eat or drink as usual.  Avoid tobacco use.  Limit alcohol intake to no more than 1 drink per day for nonpregnant women and 2 drinks per day for men. You should drink alcohol only when you are also eating food. Talk with your caregiver whether alcohol is safe for you. Tell your caregiver if you drink alcohol several times a week.  Follow up with your caregiver regularly.  Schedule an eye exam soon after the diagnosis of type 2 diabetes and then annually.  Perform daily skin and foot care. Examine your skin and feet daily for cuts, bruises, redness, nail problems, bleeding, blisters, or sores. A foot exam by a caregiver should be done annually.  Brush your teeth and gums at least twice a day and floss at least once a day. Follow up with your dentist regularly.  Share your diabetes management plan with your workplace or school.  Stay up-to-date with immunizations.  Learn to manage stress.  Obtain ongoing diabetes education and support as needed.  Participate in, or seek rehabilitation as needed to maintain or improve independence and quality of life. Request a physical or occupational therapy referral if you are having foot or hand numbness or difficulties with grooming,   dressing, eating, or physical activity. SEEK MEDICAL CARE IF:   You are unable to eat food or drink fluids for more than 6 hours.  You have nausea and vomiting for more than 6 hours.  Your blood glucose level is over 240 mg/dL.  There is a change in mental status.  You develop an additional serious illness.  You have diarrhea for more than 6 hours.  You have been sick or have had a fever for a couple of days and are not getting better.  You have pain during any physical activity.  SEEK IMMEDIATE MEDICAL CARE IF:  You have difficulty breathing.  You have moderate to large ketone levels. MAKE SURE YOU:  Understand these instructions.  Will watch your condition.  Will get help right away if  you are not doing well or get worse. Document Released: 07/23/2005 Document Revised: 04/16/2012 Document Reviewed: 02/19/2012 [employer] Patient Information 2014 [employer].  

## 2014-04-11 ENCOUNTER — Ambulatory Visit (INDEPENDENT_AMBULATORY_CARE_PROVIDER_SITE_OTHER): Payer: Self-pay | Admitting: Physician Assistant

## 2014-04-11 VITALS — BP 138/90 | HR 77 | Temp 97.9°F | Resp 16 | Ht 72.25 in | Wt 261.6 lb

## 2014-04-11 DIAGNOSIS — F32A Depression, unspecified: Secondary | ICD-10-CM

## 2014-04-11 DIAGNOSIS — F3289 Other specified depressive episodes: Secondary | ICD-10-CM

## 2014-04-11 DIAGNOSIS — F411 Generalized anxiety disorder: Secondary | ICD-10-CM

## 2014-04-11 DIAGNOSIS — G4700 Insomnia, unspecified: Secondary | ICD-10-CM

## 2014-04-11 DIAGNOSIS — F329 Major depressive disorder, single episode, unspecified: Secondary | ICD-10-CM

## 2014-04-11 DIAGNOSIS — E669 Obesity, unspecified: Secondary | ICD-10-CM | POA: Insufficient documentation

## 2014-04-11 MED ORDER — ZOLPIDEM TARTRATE 10 MG PO TABS: 10.0000 mg | ORAL_TABLET | Freq: Every evening | ORAL | Status: DC | PRN

## 2014-04-11 MED ORDER — FLUOXETINE HCL 20 MG PO TABS: ORAL_TABLET | ORAL | Status: DC

## 2014-04-11 MED ORDER — LORAZEPAM 1 MG PO TABS: 1.0000 mg | ORAL_TABLET | Freq: Two times a day (BID) | ORAL | Status: DC | PRN

## 2014-04-11 NOTE — Progress Notes (Signed)
Subjective:    Patient ID: Timothy Lowe, male    DOB: 1965/10/29, 48 y.o.   MRN: [id number]   PCP: Nilda Simmer, MD (patient states no PCP, sees whoever he sees at [facility])  Chief Complaint  Patient presents with  . Medication Refill    Prozac, Ativan, Ambien   Medications, allergies, past medical history, surgical history, family history, social history and problem list reviewed and updated.  Patient Active Problem List   Diagnosis Date Noted  . Chronic cough 12/28/2010  . Nonallopathic lesion 11/01/2010  . Eczema, dyshidrotic 11/01/2010  . INSOMNIA 07/13/2010  . MUSCLE SPASM, BACK 06/06/2010  . HYPERTENSION, BENIGN ESSENTIAL 09/12/2009  . DYSLIPIDEMIA 08/23/2009  . HYPERGLYCEMIA, FASTING 08/23/2009  . ANXIETY DEPRESSION 07/27/2009  . GERD 07/27/2009    Prior to Admission medications   Medication Sig Start Date End Date Taking? Authorizing Provider  FLUoxetine (PROZAC) 20 MG tablet Three tablets daily 10/30/13  Yes Carmelina Dane, MD  ibuprofen (ADVIL,MOTRIN) 600 MG tablet Take 1 tablet (600 mg total) by mouth every 6 (six) hours as needed for pain. 11/11/12  Yes Renne Crigler, PA-C  LORazepam (ATIVAN) 1 MG tablet Take 1 tablet (1 mg total) by mouth 2 (two) times daily as needed for anxiety. 10/30/13  Yes Carmelina Dane, MD  losartan-hydrochlorothiazide (HYZAAR) 100-12.5 MG per tablet Take 1 tablet by mouth daily. 10/30/13  Yes Carmelina Dane, MD  omeprazole (PRILOSEC) 20 MG capsule Take 1 capsule (20 mg total) by mouth daily. 10/30/13  Yes Carmelina Dane, MD  zolpidem (AMBIEN) 10 MG tablet Take 10 mg by mouth at bedtime as needed for sleep.   Yes Historical Provider, MD    HPI  This 48 y.o. male presents for evaluation of anxiety/depression, insomnia and need for medication refills.  He does not need refills of medications for GERD, HTN.  He does not have a specific PCP, "I get whoever I get here." He denies knowledge of our Controlled Substance Policy or  ever being advised to select a primary care provider. He has seen 3 different providers in his 4 previous visits.  He thinks his current medications are doing well, without adverse effects.   Review of Systems No chest pain, SOB, HA, dizziness, vision change, N/V, diarrhea, constipation, dysuria, urinary urgency or frequency, myalgias, arthralgias or rash.     Objective:   Physical Exam  Vitals reviewed. Constitutional: He is oriented to person, place, and time. Vital signs are normal. He appears well-developed and well-nourished. He is active and cooperative. No distress.  BP 138/90  Pulse 77  Temp(Src) 97.9 F (36.6 C) (Oral)  Resp 16  Ht 6' 0.25" (1.835 m)  Wt 261 lb 9.6 oz (118.661 kg)  BMI 35.24 kg/m2  SpO2 96%  HENT:  Head: Normocephalic and atraumatic.  Right Ear: Hearing normal.  Left Ear: Hearing normal.  Eyes: Conjunctivae are normal. No scleral icterus.  Neck: Normal range of motion. Neck supple. No thyromegaly present.  Cardiovascular: Normal rate, regular rhythm and normal heart sounds.   Pulses:      Radial pulses are 2+ on the right side, and 2+ on the left side.  Pulmonary/Chest: Effort normal and breath sounds normal.  Lymphadenopathy:       Head (right side): No tonsillar, no preauricular, no posterior auricular and no occipital adenopathy present.       Head (left side): No tonsillar, no preauricular, no posterior auricular and no occipital adenopathy present.    He has no  cervical adenopathy.       Right: No supraclavicular adenopathy present.       Left: No supraclavicular adenopathy present.  Neurological: He is alert and oriented to person, place, and time. No sensory deficit.  Skin: Skin is warm, dry and intact. No rash noted. No cyanosis or erythema. Nails show no clubbing.  Psychiatric: He has a normal mood and affect. His speech is normal and behavior is normal. He expresses no homicidal and no suicidal ideation.          Assessment & Plan:    1. INSOMNIA Stable. Continue. - zolpidem (AMBIEN) 10 MG tablet; Take 1 tablet (10 mg total) by mouth at bedtime as needed for sleep.  Dispense: 30 tablet; Refill: 0  2. Depression Stable. Continue. - FLUoxetine (PROZAC) 20 MG tablet; Three tablets daily  Dispense: 90 tablet; Refill: 12  3. Anxiety state, unspecified Stable. Continue. - LORazepam (ATIVAN) 1 MG tablet; Take 1 tablet (1 mg total) by mouth 2 (two) times daily as needed for anxiety.  Dispense: 60 tablet; Refill: 0  We reviewed the [facility] Controlled Substance Policy. I will be his PCP. He may contact me for refills of his medications x 6 months, then needs a follow-up visit.  He needs fasting labs at his next visit, and was advised of such. He does not have health insurance, so we agree to collect only the needed tests.  He was advised to obtain a flu vaccine at a local pharmacy, where it will cost less than at this office.  Fernande Bras, PA-C Physician Assistant-Certified [facility]

## 2014-04-11 NOTE — Patient Instructions (Signed)
At your next visit, we need to get some blood. Please fast for 8-12 hours prior, to get most accurate results.  [facility] Policy for Prescribing Controlled Substances (Revised 06/2012) 1. Prescriptions for controlled substances will be filled by ONE provider at [facility] with whom you have established and developed a plan for your care, including follow-up. 2. You are encouraged to schedule an appointment with your prescriber at our appointment center for follow-up visits whenever possible. 3. If you request a prescription for the controlled substance while at [facility] for an acute problem (with someone other than your regular prescriber), you MAY be given a ONE-TIME prescription for a 30-day supply of the controlled substance, to allow time for you to return to see your regular prescriber for additional prescriptions.

## 2014-05-04 ENCOUNTER — Other Ambulatory Visit: Payer: Self-pay | Admitting: Physician Assistant

## 2014-05-05 NOTE — Telephone Encounter (Signed)
Meds ordered this encounter  Medications  . zolpidem (AMBIEN) 10 MG tablet    Sig: TAKE ONE TABLET BY MOUTH AT BEDTIME AS NEEDED FOR SLEEP    Dispense:  30 tablet    Refill:  0  . LORazepam (ATIVAN) 1 MG tablet    Sig: TAKE ONE TABLET BY MOUTH TWICE DAILY AS NEEDED FOR ANXIETY    Dispense:  60 tablet    Refill:  0

## 2014-05-06 NOTE — Telephone Encounter (Signed)
Faxed

## 2014-06-01 ENCOUNTER — Other Ambulatory Visit: Payer: Self-pay | Admitting: Physician Assistant

## 2014-06-02 NOTE — Telephone Encounter (Signed)
Faxed

## 2014-06-29 ENCOUNTER — Other Ambulatory Visit: Payer: Self-pay | Admitting: Physician Assistant

## 2014-06-30 ENCOUNTER — Telehealth: Payer: Self-pay

## 2014-06-30 NOTE — Telephone Encounter (Signed)
Faxed Rxs, pt notified.

## 2014-06-30 NOTE — Telephone Encounter (Signed)
Patient requesting his 2 medications "Ativan" and "Ambien" [facility] [address]. Per patient he has enough medicine till Friday but will be out of town. Patients call back number is [phone]

## 2014-06-30 NOTE — Telephone Encounter (Signed)
[facility] for [facility] rx's that we sent to them an hr ago. Called [facility] and they had received them. Tried to call pt back to let him know but his phone was busy. I had told him that if they had not received them that I would call them in for him

## 2014-07-26 ENCOUNTER — Other Ambulatory Visit: Payer: Self-pay | Admitting: Physician Assistant

## 2014-07-26 NOTE — Telephone Encounter (Signed)
Faxed

## 2014-07-26 NOTE — Telephone Encounter (Signed)
rx printed.  Meds ordered this encounter  Medications  . LORazepam (ATIVAN) 1 MG tablet    Sig: TAKE ONE TABLET BY MOUTH TWICE DAILY AS NEEDED FOR ANXIETY    Dispense:  60 tablet    Refill:  0  . zolpidem (AMBIEN) 10 MG tablet    Sig: TAKE ONE TABLET BY MOUTH AT BEDTIME AS NEEDED FOR SLEEP    Dispense:  30 tablet    Refill:  0

## 2014-08-21 ENCOUNTER — Other Ambulatory Visit: Payer: Self-pay | Admitting: Physician Assistant

## 2014-08-23 NOTE — Telephone Encounter (Signed)
Pt states xanax was sent to pharmacy instead of ativan. Pt wants to know if this was done on purpose or not. CB # [phone]

## 2014-08-23 NOTE — Telephone Encounter (Signed)
Patient called to check status of refill request for: ambien and lorazepam he said its been requested this weekend. He wanted to change pharmacies because [facility] has tripled his copay. [facility] on [address]. The telephone number there is: [phone]- [facility]   Best for patient: [phone]

## 2014-08-23 NOTE — Telephone Encounter (Signed)
Script called in to pharmacy  

## 2014-08-23 NOTE — Telephone Encounter (Signed)
Pt called back to check status only bc he runs out tomorrow and is off work today. I advised that Chelle wasn't here over the weekend, but is today and I'm sure she will be addressing this between seeing pts. Pt would like CB when done.

## 2014-08-23 NOTE — Telephone Encounter (Signed)
Spoke to Timothy Lowe the pharmacist at [facility] [address]. He says it was their mistake on the ativan. Pharmacist is going to leave a detailed message for the crew tomorrow. He has already fixed the xanax to Ativan.  Pt is to take Xanax back tomorrow and receive the Ativan. Spoke to pt and voiced understanding.

## 2014-08-25 NOTE — Telephone Encounter (Signed)
I spoke to SYSCOCallie bc [facility] had called to check on these denials. Callie reported that BOTH of the pended Rxs below were called in to [facility] [address] and pt has them. [facility]

## 2014-09-01 ENCOUNTER — Telehealth: Payer: Self-pay

## 2014-09-01 NOTE — Telephone Encounter (Signed)
Patient is requesting to speak with Theora Gianottihelle Jeffrey PA or a nurse regarding his "Ambien" medication. Per patient he has a couple of questions about it. Patients call back number is [phone] for today until 4pm if after 4pm patient request to call him tomorrow when he is off.

## 2014-09-02 NOTE — Telephone Encounter (Signed)
LMOM to call back to get details on questions about Ambien.

## 2014-09-06 MED ORDER — SUVOREXANT 10 MG PO TABS: 10.0000 mg | ORAL_TABLET | Freq: Every evening | ORAL | Status: DC | PRN

## 2014-09-06 NOTE — Telephone Encounter (Signed)
I have RX. Left message for pt to call back.

## 2014-09-06 NOTE — Telephone Encounter (Signed)
Spoke with pt, advised we have a Rx for him. Rx faxed.

## 2014-09-06 NOTE — Telephone Encounter (Signed)
I've printed this prescription.  We should discuss alternatives to sedatives for addressing his insomnia when he's here next, in March.  Meds ordered this encounter  Medications  . Suvorexant (BELSOMRA) 10 MG TABS    Sig: Take 10 mg by mouth at bedtime as needed (insomnia).    Dispense:  10 tablet    Refill:  0    Order Specific Question:  Supervising Provider    Answer:  DOOLITTLE, ROBERT P [[id number]]

## 2014-09-06 NOTE — Telephone Encounter (Signed)
Pt Timothy Lowe and LM stating that the Remus Lofflerambien is beginning to lose it's effectiveness bc he has been on it several years. He has a coupon for free 10 day trial of Belsomra and wanted to ask Chelle if she could write a Rx for 10 days. Please advise.

## 2014-09-16 ENCOUNTER — Other Ambulatory Visit: Payer: Self-pay | Admitting: Physician Assistant

## 2014-09-20 NOTE — Telephone Encounter (Signed)
Faxed and notified pt of RFs and need for f/up. Pt agreed to come in bf these run out.

## 2014-09-20 NOTE — Telephone Encounter (Signed)
Rxs printed. Please remind patient that he needs to come in to see me for the next fill.  Meds ordered this encounter  Medications  . zolpidem (AMBIEN) 10 MG tablet    Sig: TAKE 1 TABLET BY MOUTH AT BEDTIME    Dispense:  30 tablet    Refill:  0    Not to exceed 5 additional fills before 02/19/2015  . LORazepam (ATIVAN) 1 MG tablet    Sig: TAKE 1 TABLET TWICE A DAY AS NEEDED    Dispense:  60 tablet    Refill:  0    Not to exceed 5 additional fills before 02/19/2015

## 2014-10-19 ENCOUNTER — Telehealth: Payer: Self-pay

## 2014-10-22 ENCOUNTER — Ambulatory Visit (INDEPENDENT_AMBULATORY_CARE_PROVIDER_SITE_OTHER): Payer: Self-pay | Admitting: Emergency Medicine

## 2014-10-22 ENCOUNTER — Other Ambulatory Visit: Payer: Self-pay | Admitting: Emergency Medicine

## 2014-10-22 VITALS — BP 128/82 | HR 66 | Temp 97.9°F | Resp 18 | Ht 72.0 in | Wt 249.0 lb

## 2014-10-22 DIAGNOSIS — F411 Generalized anxiety disorder: Secondary | ICD-10-CM

## 2014-10-22 DIAGNOSIS — I10 Essential (primary) hypertension: Secondary | ICD-10-CM

## 2014-10-22 DIAGNOSIS — F32A Depression, unspecified: Secondary | ICD-10-CM

## 2014-10-22 DIAGNOSIS — F329 Major depressive disorder, single episode, unspecified: Secondary | ICD-10-CM

## 2014-10-22 DIAGNOSIS — E785 Hyperlipidemia, unspecified: Secondary | ICD-10-CM

## 2014-10-22 LAB — COMPLETE METABOLIC PANEL WITH GFR
ALT: 117 U/L — ABNORMAL HIGH (ref 0–53)
AST: 64 U/L — ABNORMAL HIGH (ref 0–37)
Albumin: 4.7 g/dL (ref 3.5–5.2)
Alkaline Phosphatase: 74 U/L (ref 39–117)
BUN: 17 mg/dL (ref 6–23)
CALCIUM: 10.2 mg/dL (ref 8.4–10.5)
CHLORIDE: 98 meq/L (ref 96–112)
CO2: 25 mEq/L (ref 19–32)
CREATININE: 0.89 mg/dL (ref 0.50–1.35)
Glucose, Bld: 182 mg/dL — ABNORMAL HIGH (ref 70–99)
Potassium: 4.1 mEq/L (ref 3.5–5.3)
Sodium: 136 mEq/L (ref 135–145)
Total Bilirubin: 0.7 mg/dL (ref 0.2–1.2)
Total Protein: 7.5 g/dL (ref 6.0–8.3)

## 2014-10-22 LAB — POCT CBC
GRANULOCYTE PERCENT: 56.5 % (ref 37–80)
HCT, POC: 50.8 % (ref 43.5–53.7)
Hemoglobin: 16.5 g/dL (ref 14.1–18.1)
Lymph, poc: 2.3 (ref 0.6–3.4)
MCH, POC: 30.1 pg (ref 27–31.2)
MCHC: 32.6 g/dL (ref 31.8–35.4)
MCV: 92.4 fL (ref 80–97)
MID (cbc): 0.6 (ref 0–0.9)
MPV: 7.5 fL (ref 0–99.8)
POC Granulocyte: 3.8 (ref 2–6.9)
POC LYMPH %: 35 % (ref 10–50)
POC MID %: 8.5 %M (ref 0–12)
Platelet Count, POC: 310 10*3/uL (ref 142–424)
RBC: 5.49 M/uL (ref 4.69–6.13)
RDW, POC: 14.1 %
WBC: 6.7 10*3/uL (ref 4.6–10.2)

## 2014-10-22 LAB — LIPID PANEL
CHOL/HDL RATIO: 4.8 ratio
Cholesterol: 309 mg/dL — ABNORMAL HIGH (ref 0–200)
HDL: 64 mg/dL (ref >=40)
LDL CALC: 222 mg/dL — AB (ref 0–99)
TRIGLYCERIDES: 116 mg/dL (ref <150)
VLDL: 23 mg/dL (ref 0–40)

## 2014-10-22 MED ORDER — LORAZEPAM 1 MG PO TABS: 1.0000 mg | ORAL_TABLET | Freq: Two times a day (BID) | ORAL | Status: DC | PRN

## 2014-10-22 MED ORDER — LOSARTAN POTASSIUM-HCTZ 100-12.5 MG PO TABS: 1.0000 | ORAL_TABLET | Freq: Every day | ORAL | Status: DC

## 2014-10-22 MED ORDER — ZOLPIDEM TARTRATE 10 MG PO TABS: 10.0000 mg | ORAL_TABLET | Freq: Every day | ORAL | Status: DC

## 2014-10-22 MED ORDER — FLUOXETINE HCL 20 MG PO TABS: ORAL_TABLET | ORAL | Status: AC
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
## 2014-10-25 LAB — HEPATITIS B SURFACE ANTIGEN: Hepatitis B Surface Ag: NEGATIVE

## 2014-10-25 LAB — HEPATITIS C ANTIBODY: HCV Ab: NEGATIVE

## 2014-10-30 ENCOUNTER — Telehealth: Payer: Self-pay

## 2014-10-30 NOTE — Telephone Encounter (Signed)
Pt called back about labs. Notified. He will RTC in 4-6 weeks for f/u

## 2015-05-18 ENCOUNTER — Other Ambulatory Visit: Payer: Self-pay

## 2015-05-18 ENCOUNTER — Ambulatory Visit (INDEPENDENT_AMBULATORY_CARE_PROVIDER_SITE_OTHER): Payer: Self-pay | Admitting: Emergency Medicine

## 2015-05-18 VITALS — BP 148/98 | HR 76 | Temp 98.5°F | Resp 16 | Ht 72.0 in | Wt 257.0 lb

## 2015-05-18 DIAGNOSIS — G4700 Insomnia, unspecified: Secondary | ICD-10-CM

## 2015-05-18 DIAGNOSIS — I10 Essential (primary) hypertension: Secondary | ICD-10-CM

## 2015-05-18 DIAGNOSIS — F411 Generalized anxiety disorder: Secondary | ICD-10-CM

## 2015-05-18 MED ORDER — LORAZEPAM 1 MG PO TABS: 1.0000 mg | ORAL_TABLET | Freq: Two times a day (BID) | ORAL | Status: DC | PRN

## 2015-05-18 MED ORDER — ZOLPIDEM TARTRATE 10 MG PO TABS: 10.0000 mg | ORAL_TABLET | Freq: Every day | ORAL | Status: AC

## 2015-05-18 MED ORDER — AMLODIPINE BESYLATE 5 MG PO TABS: 5.0000 mg | ORAL_TABLET | Freq: Every day | ORAL | Status: DC

## 2015-05-18 MED ORDER — LOSARTAN POTASSIUM-HCTZ 100-12.5 MG PO TABS: 1.0000 | ORAL_TABLET | Freq: Every day | ORAL | Status: DC

## 2015-05-18 NOTE — Patient Instructions (Signed)
DASH Eating Plan  DASH stands for "Dietary Approaches to Stop Hypertension." The DASH eating plan is a healthy eating plan that has been shown to reduce high blood pressure (hypertension). Additional health benefits may include reducing the risk of type 2 diabetes mellitus, heart disease, and stroke. The DASH eating plan may also help with weight loss.  WHAT DO I NEED TO KNOW ABOUT THE DASH EATING PLAN?  For the DASH eating plan, you will follow these general guidelines:  · Choose foods with a percent daily value for sodium of less than 5% (as listed on the food label).  · Use salt-free seasonings or herbs instead of table salt or sea salt.  · Check with your health care provider or pharmacist before using salt substitutes.  · Eat lower-sodium products, often labeled as "lower sodium" or "no salt added."  · Eat fresh foods.  · Eat more vegetables, fruits, and low-fat dairy products.  · Choose whole grains. Look for the word "whole" as the first word in the ingredient list.  · Choose fish and skinless chicken or turkey more often than red meat. Limit fish, poultry, and meat to 6 oz (170 g) each day.  · Limit sweets, desserts, sugars, and sugary drinks.  · Choose heart-healthy fats.  · Limit cheese to 1 oz (28 g) per day.  · Eat more home-cooked food and less restaurant, buffet, and fast food.  · Limit fried foods.  · Cook foods using methods other than frying.  · Limit canned vegetables. If you do use them, rinse them well to decrease the sodium.  · When eating at a restaurant, ask that your food be prepared with less salt, or no salt if possible.  WHAT FOODS CAN I EAT?  Seek help from a dietitian for individual calorie needs.  Grains  Whole grain or whole wheat bread. Brown rice. Whole grain or whole wheat pasta. Quinoa, bulgur, and whole grain cereals. Low-sodium cereals. Corn or whole wheat flour tortillas. Whole grain cornbread. Whole grain crackers. Low-sodium crackers.  Vegetables  Fresh or frozen vegetables  (raw, steamed, roasted, or grilled). Low-sodium or reduced-sodium tomato and vegetable juices. Low-sodium or reduced-sodium tomato sauce and paste. Low-sodium or reduced-sodium canned vegetables.   Fruits  All fresh, canned (in natural juice), or frozen fruits.  Meat and Other Protein Products  Ground beef (85% or leaner), grass-fed beef, or beef trimmed of fat. Skinless chicken or turkey. Ground chicken or turkey. Pork trimmed of fat. All fish and seafood. Eggs. Dried beans, peas, or lentils. Unsalted nuts and seeds. Unsalted canned beans.  Dairy  Low-fat dairy products, such as skim or 1% milk, 2% or reduced-fat cheeses, low-fat ricotta or cottage cheese, or plain low-fat yogurt. Low-sodium or reduced-sodium cheeses.  Fats and Oils  Tub margarines without trans fats. Light or reduced-fat mayonnaise and salad dressings (reduced sodium). Avocado. Safflower, olive, or canola oils. Natural peanut or almond butter.  Other  Unsalted popcorn and pretzels.  The items listed above may not be a complete list of recommended foods or beverages. Contact your dietitian for more options.  WHAT FOODS ARE NOT RECOMMENDED?  Grains  White bread. White pasta. White rice. Refined cornbread. Bagels and croissants. Crackers that contain trans fat.  Vegetables  Creamed or fried vegetables. Vegetables in a cheese sauce. Regular canned vegetables. Regular canned tomato sauce and paste. Regular tomato and vegetable juices.  Fruits  Dried fruits. Canned fruit in light or heavy syrup. Fruit juice.  Meat and Other Protein   Products  Fatty cuts of meat. Ribs, chicken wings, bacon, sausage, bologna, salami, chitterlings, fatback, hot dogs, bratwurst, and packaged luncheon meats. Salted nuts and seeds. Canned beans with salt.  Dairy  Whole or 2% milk, cream, half-and-half, and cream cheese. Whole-fat or sweetened yogurt. Full-fat cheeses or blue cheese. Nondairy creamers and whipped toppings. Processed cheese, cheese spreads, or cheese  curds.  Condiments  Onion and garlic salt, seasoned salt, table salt, and sea salt. Canned and packaged gravies. Worcestershire sauce. Tartar sauce. Barbecue sauce. Teriyaki sauce. Soy sauce, including reduced sodium. Steak sauce. Fish sauce. Oyster sauce. Cocktail sauce. Horseradish. Ketchup and mustard. Meat flavorings and tenderizers. Bouillon cubes. Hot sauce. Tabasco sauce. Marinades. Taco seasonings. Relishes.  Fats and Oils  Butter, stick margarine, lard, shortening, ghee, and bacon fat. Coconut, palm kernel, or palm oils. Regular salad dressings.  Other  Pickles and olives. Salted popcorn and pretzels.  The items listed above may not be a complete list of foods and beverages to avoid. Contact your dietitian for more information.  WHERE CAN I FIND MORE INFORMATION?  National Heart, Lung, and Blood Institute: www.nhlbi.nih.gov/health/health-topics/topics/dash/     This information is not intended to replace advice given to you by your health care provider. Make sure you discuss any questions you have with your health care provider.     Document Released: 07/12/2011 Document Revised: 08/13/2014 Document Reviewed: 05/27/2013  Elsevier Interactive Patient Education ©2016 [employer]

## 2015-05-18 NOTE — Progress Notes (Signed)
Patient ID: Timothy Lowe, male   DOB: [birth year], 49 y.o.   MRN: [id number]    By signing my name below, I, Timothy Lowe, attest that this documentation has been prepared under the direction and in the presence of Collene Gobble, MD Electronically Signed: Charline Bills, ED Scribe 05/18/2015 at 4:27 PM.  Chief Complaint:  Chief Complaint  Patient presents with  . Medication Refill  . Hypertension    x 1 month   HPI: Timothy Lowe is a 49 y.o. male, with a h/o HTN and hyperlipidemia, who reports to [facility] today for elevated blood pressure for the past month. Pt states that he checks his blood pressure every time he goes to [facility] and has noticed that his diastolic pressure fluctuates between 91-100. He has noticed associated warmth to his ears since his BP has been elevated. He attributes elevated BP to consuming a jug of V8 splash daily. Pt's triage BP is 148/98. BP during examination is 140/102 on the left and 150/106 on the right.   Anxiety/Depression  Pt has been compliant with Prozac which is prescribed by his psychiatrist. He only takes Ativan x1 every 3-4 days.  Insomnia  Pt was taking Ambien nightly until he ran out. He states that he goes 2-3 days without sleeping until he "crashes" on the third day. Pt denies h/o sleep apnea. He has had a sleep study done that was normal.   Immunizations Pt does not want a flu vaccine today.   Past Medical History  Diagnosis Date  . GERD (gastroesophageal reflux disease)   . Depression   . Hypertension   . Hyperlipidemia   . Eczema   . Anxiety   . Insomnia   . Allergy    Past Surgical History  Procedure Laterality Date  . Hernia repair     Social History   Social History  . Marital Status: Single    Spouse Name: n/a  . Number of Children: 0  . Years of Education: 16   Occupational History  . [employer]    Social History Main Topics  . Smoking status: Never Smoker   . Smokeless tobacco: Former Neurosurgeon    Types:  Chew     Comment: used chew in college  . Alcohol Use: 0.0 - 0.6 oz/week    0-1 Glasses of wine per week     Comment: 1 glass with dinner 2-3 times/month  . Drug Use: No  . Sexual Activity:    Partners: Female   Other Topics Concern  . None   Social History Narrative   Lives alone. His parents live in [place], [place]                     Family History  Problem Relation Age of Onset  . Diabetes Sister    No Known Allergies Prior to Admission medications   Medication Sig Start Date End Date Taking? Authorizing Provider  FLUoxetine (PROZAC) 20 MG tablet Three tablets daily 10/22/14  Yes Collene Gobble, MD  ibuprofen (ADVIL,MOTRIN) 600 MG tablet Take 1 tablet (600 mg total) by mouth every 6 (six) hours as needed for pain. 11/11/12  Yes Renne Crigler, PA-C  LORazepam (ATIVAN) 1 MG tablet Take 1 tablet (1 mg total) by mouth 2 (two) times daily as needed. 10/22/14  Yes Collene Gobble, MD  losartan-hydrochlorothiazide (HYZAAR) 100-12.5 MG per tablet Take 1 tablet by mouth daily. 10/22/14  Yes Collene Gobble, MD  omeprazole (PRILOSEC) 20  MG capsule Take 1 capsule (20 mg total) by mouth daily. 10/30/13  Yes Carmelina DaneJeffery S Anderson, MD  zolpidem (AMBIEN) 10 MG tablet Take 1 tablet (10 mg total) by mouth at bedtime. 10/22/14  Yes Collene GobbleSteven A Ryli Standlee, MD   ROS: The patient denies fevers, chills, night sweats, unintentional weight loss, chest pain, palpitations, wheezing, dyspnea on exertion, nausea, vomiting, abdominal pain, dysuria, hematuria, melena, numbness, weakness, or tingling. +difficulty sleeping  All other systems have been reviewed and were otherwise negative with the exception of those mentioned in the HPI and as above.    PHYSICAL EXAM: Filed Vitals:   05/18/15 1456  BP: 148/98  Pulse: 76  Temp: 98.5 F (36.9 C)  Resp: 16   Body mass index is 34.85 kg/(m^2).  General: Alert, no acute distress HEENT:  Normocephalic, atraumatic, oropharynx patent. Eye: Nonie HoyerOMI, [facility] Cardiovascular:   Regular rate and rhythm, no rubs, murmurs or gallops. No Carotid bruits, radial pulse intact. No pedal edema. BP during examination: 140/102 on L, 150/106 on R. Respiratory: Clear to auscultation bilaterally. No wheezes, rales, or rhonchi. No cyanosis, no use of accessory musculature Abdominal: No organomegaly, abdomen is soft and non-tender, positive bowel sounds. No masses. Musculoskeletal: Gait intact. No edema, tenderness Skin: No rashes. Neurologic: Facial musculature symmetric. Psychiatric: Patient acts appropriately throughout our interaction. Lymphatic: No cervical or submandibular lymphadenopathy Meds ordered this encounter  Medications  . zolpidem (AMBIEN) 10 MG tablet    Sig: Take 1 tablet (10 mg total) by mouth at bedtime.    Dispense:  30 tablet    Refill:  5    Not to exceed 5 additional fills before 02/19/2015  . losartan-hydrochlorothiazide (HYZAAR) 100-12.5 MG tablet    Sig: Take 1 tablet by mouth daily.    Dispense:  90 tablet    Refill:  1  . LORazepam (ATIVAN) 1 MG tablet    Sig: Take 1 tablet (1 mg total) by mouth 2 (two) times daily as needed.    Dispense:  60 tablet    Refill:  5    Not to exceed 5 additional fills before 02/19/2015  . amLODipine (NORVASC) 5 MG tablet    Sig: Take 1 tablet (5 mg total) by mouth daily.    Dispense:  90 tablet    Refill:  3   LABS:  EKG/XRAY:   [facility]  ASSESSMENT/PLAN:  Meds were refilled. He will return to clinic for fasting blood work. His ride was waiting for him. No changes made. He does have a psychiatrist he also sees.I personally performed the services described in this documentation, which was scribed in my presence. The recorded information has been reviewed and is accurate. I added amlodipine 5 mg 1 a day to his current lisinopril HCTZ for better blood pressure control.I personally performed the services described in this documentation, which was scribed in my presence. The  recorded information has been reviewed and is accurate.  Gross sideeffects, risk and benefits, and alternatives of medications d/w patient. Patient is aware that all medications have potential sideeffects and we are unable to predict every sideeffect or drug-drug interaction that may occur.  Lesle ChrisSteven Clancey Welton MD 05/18/2015 4:15 PM

## 2015-11-01 ENCOUNTER — Telehealth: Payer: Self-pay | Admitting: *Deleted

## 2015-11-01 NOTE — Telephone Encounter (Signed)
[facility] and stated pt has been getting controlled rxs from us and another provider.  They just want us to be aware.  They are contacting the other provider as well.  Just FYI

## 2015-11-01 NOTE — Telephone Encounter (Signed)
Please flag the chart the patient is receiving a prescribed substances from another provider. No further prescriptions from our office. Patient would have to come in and discuss.

## 2015-11-02 NOTE — Telephone Encounter (Signed)
Pt req. Antibiotic// became upset when informed him that we would have to have in office for eval  [phone]

## 2015-11-10 ENCOUNTER — Other Ambulatory Visit: Payer: Self-pay | Admitting: Emergency Medicine

## 2015-12-06 ENCOUNTER — Ambulatory Visit (INDEPENDENT_AMBULATORY_CARE_PROVIDER_SITE_OTHER): Payer: Self-pay | Admitting: Emergency Medicine

## 2015-12-06 VITALS — BP 116/86 | HR 67 | Temp 98.2°F | Resp 16 | Wt 240.8 lb

## 2015-12-06 DIAGNOSIS — E785 Hyperlipidemia, unspecified: Secondary | ICD-10-CM

## 2015-12-06 DIAGNOSIS — E1165 Type 2 diabetes mellitus with hyperglycemia: Secondary | ICD-10-CM | POA: Insufficient documentation

## 2015-12-06 DIAGNOSIS — R739 Hyperglycemia, unspecified: Secondary | ICD-10-CM

## 2015-12-06 DIAGNOSIS — I10 Essential (primary) hypertension: Secondary | ICD-10-CM

## 2015-12-06 DIAGNOSIS — IMO0002 Reserved for concepts with insufficient information to code with codable children: Secondary | ICD-10-CM | POA: Insufficient documentation

## 2015-12-06 DIAGNOSIS — E119 Type 2 diabetes mellitus without complications: Secondary | ICD-10-CM

## 2015-12-06 DIAGNOSIS — F411 Generalized anxiety disorder: Secondary | ICD-10-CM

## 2015-12-06 LAB — POCT CBC
GRANULOCYTE PERCENT: 56.3 % (ref 37–80)
HCT, POC: 49.8 % (ref 43.5–53.7)
Hemoglobin: 17.9 g/dL (ref 14.1–18.1)
Lymph, poc: 3.2 (ref 0.6–3.4)
MCH: 32.7 pg — AB (ref 27–31.2)
MCHC: 35.9 g/dL — AB (ref 31.8–35.4)
MCV: 91.2 fL (ref 80–97)
MID (CBC): 0.8 (ref 0–0.9)
MPV: 7.5 fL (ref 0–99.8)
PLATELET COUNT, POC: 312 10*3/uL (ref 142–424)
POC Granulocyte: 5.1 (ref 2–6.9)
POC LYMPH %: 35 % (ref 10–50)
POC MID %: 8.7 % (ref 0–12)
RBC: 5.47 M/uL (ref 4.69–6.13)
RDW, POC: 12.5 %
WBC: 9 10*3/uL (ref 4.6–10.2)

## 2015-12-06 LAB — COMPLETE METABOLIC PANEL WITH GFR
ALBUMIN: 4.5 g/dL (ref 3.6–5.1)
ALK PHOS: 71 U/L (ref 40–115)
ALT: 246 U/L — AB (ref 9–46)
AST: 122 U/L — ABNORMAL HIGH (ref 10–40)
BILIRUBIN TOTAL: 0.6 mg/dL (ref 0.2–1.2)
BUN: 16 mg/dL (ref 7–25)
CO2: 24 mmol/L (ref 20–31)
CREATININE: 0.85 mg/dL (ref 0.60–1.35)
Calcium: 9.5 mg/dL (ref 8.6–10.3)
Chloride: 95 mmol/L — ABNORMAL LOW (ref 98–110)
GFR, Est African American: >89 mL/min (ref >=60)
GLUCOSE: 254 mg/dL — AB (ref 65–99)
Potassium: 3.9 mmol/L (ref 3.5–5.3)
SODIUM: 131 mmol/L — AB (ref 135–146)
TOTAL PROTEIN: 7.1 g/dL (ref 6.1–8.1)

## 2015-12-06 LAB — CBC
HCT: 50.5 % — ABNORMAL HIGH (ref 38.5–50.0)
Hemoglobin: 17.9 g/dL — ABNORMAL HIGH (ref 13.2–17.1)
MCH: 32.2 pg (ref 27.0–33.0)
MCHC: 35.4 g/dL (ref 32.0–36.0)
MCV: 90.8 fL (ref 80.0–100.0)
MPV: 10.4 fL (ref 7.5–12.5)
PLATELETS: 340 10*3/uL (ref 140–400)
RBC: 5.56 MIL/uL (ref 4.20–5.80)
RDW: 13.2 % (ref 11.0–15.0)
WBC: 8.3 10*3/uL (ref 3.8–10.8)

## 2015-12-06 LAB — GLUCOSE, POCT (MANUAL RESULT ENTRY): POC Glucose: 244 mg/dl — AB (ref 70–99)

## 2015-12-06 LAB — POCT GLYCOSYLATED HEMOGLOBIN (HGB A1C): HEMOGLOBIN A1C: 11.3

## 2015-12-06 MED ORDER — LOSARTAN POTASSIUM-HCTZ 100-12.5 MG PO TABS: 1.0000 | ORAL_TABLET | Freq: Every day | ORAL | Status: DC

## 2015-12-06 MED ORDER — LORAZEPAM 1 MG PO TABS: ORAL_TABLET | ORAL | Status: DC

## 2015-12-06 MED ORDER — AMLODIPINE BESYLATE 5 MG PO TABS
5.0000 mg | ORAL_TABLET | Freq: Every day | ORAL | Status: DC
Start: 2015-12-06 — End: 2016-05-25

## 2015-12-06 MED ORDER — METFORMIN HCL 500 MG PO TABS: 500.0000 mg | ORAL_TABLET | Freq: Two times a day (BID) | ORAL | Status: DC

## 2015-12-06 NOTE — Patient Instructions (Addendum)
   IF you received an x-ray today, you will receive an invoice from [facility]. Please contact [facility] at [phone] with questions or concerns regarding your invoice.   IF you received labwork today, you will receive an invoice from [facility]. Please contact [facility] at [phone] with questions or concerns regarding your invoice.   Our billing staff will not be able to assist you with questions regarding bills from these companies.  You will be contacted with the lab results as soon as they are available. The fastest way to get your results is to activate your My Chart account. Instructions are located on the last page of this paperwork. If you have not heard from us regarding the results in 2 weeks, please contact this office.     Type 2 Diabetes Mellitus, Adult Type 2 diabetes mellitus, often simply referred to as type 2 diabetes, is a long-lasting (chronic) disease. In type 2 diabetes, the pancreas does not make enough insulin (a hormone), the cells are less responsive to the insulin that is made (insulin resistance), or both. Normally, insulin moves sugars from food into the tissue cells. The tissue cells use the sugars for energy. The lack of insulin or the lack of normal response to insulin causes excess sugars to build up in the blood instead of going into the tissue cells. As a result, high blood sugar (hyperglycemia) develops. The effect of high sugar (glucose) levels can cause many complications. Type 2 diabetes was also previously called adult-onset diabetes, but it can occur at any age.  RISK FACTORS  A person is predisposed to developing type 2 diabetes if someone in the family has the disease and also has one or more of the following primary risk factors:  Weight gain, or being overweight or obese.  An inactive lifestyle.  A history of consistently eating high-calorie foods. Maintaining a normal weight and regular  physical activity can reduce the chance of developing type 2 diabetes. SYMPTOMS  A person with type 2 diabetes may not show symptoms initially. The symptoms of type 2 diabetes appear slowly. The symptoms include:  Increased thirst (polydipsia).  Increased urination (polyuria).  Increased urination during the night (nocturia).  Sudden or unexplained weight changes.  Frequent, recurring infections.  Tiredness (fatigue).  Weakness.  Vision changes, such as blurred vision.  Fruity smell to your breath.  Abdominal pain.  Nausea or vomiting.  Cuts or bruises which are slow to heal.  Tingling or numbness in the hands or feet.  An open skin wound (ulcer). DIAGNOSIS Type 2 diabetes is frequently not diagnosed until complications of diabetes are present. Type 2 diabetes is diagnosed when symptoms or complications are present and when blood glucose levels are increased. Your blood glucose level may be checked by one or more of the following blood tests:  A fasting blood glucose test. You will not be allowed to eat for at least 8 hours before a blood sample is taken.  A random blood glucose test. Your blood glucose is checked at any time of the day regardless of when you ate.  A hemoglobin A1c blood glucose test. A hemoglobin A1c test provides information about blood glucose control over the previous 3 months.  An oral glucose tolerance test (OGTT). Your blood glucose is measured after you have not eaten (fasted) for 2 hours and then after you drink a glucose-containing beverage. TREATMENT   You may need to take insulin or diabetes medicine daily to keep blood glucose   levels in the desired range.  If you use insulin, you may need to adjust the dosage depending on the carbohydrates that you eat with each meal or snack.  Lifestyle changes are recommended as part of your treatment. These may include:  Following an individualized diet plan developed by a nutritionist or  dietitian.  Exercising daily. Your health care providers will set individualized treatment goals for you based on your age, your medicines, how long you have had diabetes, and any other medical conditions you have. Generally, the goal of treatment is to maintain the following blood glucose levels:  Before meals (preprandial): 80-130 mg/dL.  After meals (postprandial): below 180 mg/dL.  A1c: less than 6.5-7%. HOME CARE INSTRUCTIONS   Have your hemoglobin A1c level checked twice a year.  Perform daily blood glucose monitoring as directed by your health care provider.  Monitor urine ketones when you are ill and as directed by your health care provider.  Take your diabetes medicine or insulin as directed by your health care provider to maintain your blood glucose levels in the desired range.  Never run out of diabetes medicine or insulin. It is needed every day.  If you are using insulin, you may need to adjust the amount of insulin given based on your intake of carbohydrates. Carbohydrates can raise blood glucose levels but need to be included in your diet. Carbohydrates provide vitamins, minerals, and fiber which are an essential part of a healthy diet. Carbohydrates are found in fruits, vegetables, whole grains, dairy products, legumes, and foods containing added sugars.  Eat healthy foods. You should make an appointment to see a registered dietitian to help you create an eating plan that is right for you.  Lose weight if you are overweight.  Carry a medical alert card or wear your medical alert jewelry.  Carry a 15-gram carbohydrate snack with you at all times to treat low blood glucose (hypoglycemia). Some examples of 15-gram carbohydrate snacks include:  Glucose tablets, 3 or 4.  Glucose gel, 15-gram tube.  Raisins, 2 tablespoons (24 grams).  Jelly beans, 6.  Animal crackers, 8.  Regular pop, 4 ounces (120 mL).  Gummy treats, 9.  Recognize hypoglycemia. Hypoglycemia  occurs with blood glucose levels of 70 mg/dL and below. The risk for hypoglycemia increases when fasting or skipping meals, during or after intense exercise, and during sleep. Hypoglycemia symptoms can include:  Tremors or shakes.  Decreased ability to concentrate.  Sweating.  Increased heart rate.  Headache.  Dry mouth.  Hunger.  Irritability.  Anxiety.  Restless sleep.  Altered speech or coordination.  Confusion.  Treat hypoglycemia promptly. If you are alert and able to safely swallow, follow the 15:15 rule:  Take 15-20 grams of rapid-acting glucose or carbohydrate. Rapid-acting options include glucose gel, glucose tablets, or 4 ounces (120 mL) of fruit juice, regular soda, or low-fat milk.  Check your blood glucose level 15 minutes after taking the glucose.  Take 15-20 grams more of glucose if the repeat blood glucose level is still 70 mg/dL or below.  Eat a meal or snack within 1 hour once blood glucose levels return to normal.  Be alert to feeling very thirsty and urinating more frequently than usual, which are early signs of hyperglycemia. An early awareness of hyperglycemia allows for prompt treatment. Treat hyperglycemia as directed by your health care provider.  Engage in at least 150 minutes of moderate-intensity physical activity a week, spread over at least 3 days of the week or as directed   by your health care provider. In addition, you should engage in resistance exercise at least 2 times a week or as directed by your health care provider. Try to spend no more than 90 minutes at one time inactive.  Adjust your medicine and food intake as needed if you start a new exercise or sport.  Follow your sick-day plan anytime you are unable to eat or drink as usual.  Do not use any tobacco products including cigarettes, chewing tobacco, or electronic cigarettes. If you need help quitting, ask your health care provider.  Limit alcohol intake to no more than 1 drink  per day for nonpregnant women and 2 drinks per day for men. You should drink alcohol only when you are also eating food. Talk with your health care provider whether alcohol is safe for you. Tell your health care provider if you drink alcohol several times a week.  Keep all follow-up visits as directed by your health care provider. This is important.  Schedule an eye exam soon after the diagnosis of type 2 diabetes and then annually.  Perform daily skin and foot care. Examine your skin and feet daily for cuts, bruises, redness, nail problems, bleeding, blisters, or sores. A foot exam by a health care provider should be done annually.  Brush your teeth and gums at least twice a day and floss at least once a day. Follow up with your dentist regularly.  Share your diabetes management plan with your workplace or school.  Keep your immunizations up to date. It is recommended that you receive a flu (influenza) vaccine every year. It is also recommended that you receive a pneumonia (pneumococcal) vaccine. If you are 48 years of age or older and have never received a pneumonia vaccine, this vaccine may be given as a series of two separate shots. Ask your health care provider which additional vaccines may be recommended.  Learn to manage stress.  Obtain ongoing diabetes education and support as needed.  Participate in or seek rehabilitation as needed to maintain or improve independence and quality of life. Request a physical or occupational therapy referral if you are having foot or hand numbness, or difficulties with grooming, dressing, eating, or physical activity. SEEK MEDICAL CARE IF:   You are unable to eat food or drink fluids for more than 6 hours.  You have nausea and vomiting for more than 6 hours.  Your blood glucose level is over 240 mg/dL.  There is a change in mental status.  You develop an additional serious illness.  You have diarrhea for more than 6 hours.  You have been sick  or have had a fever for a couple of days and are not getting better.  You have pain during any physical activity.  SEEK IMMEDIATE MEDICAL CARE IF:  You have difficulty breathing.  You have moderate to large ketone levels.   This information is not intended to replace advice given to you by your health care provider. Make sure you discuss any questions you have with your health care provider.   Document Released: 07/23/2005 Document Revised: 04/13/2015 Document Reviewed: 02/19/2012 Elsevier Interactive Patient Education 2016 Elsevier Inc. DASH Eating Plan DASH stands for "Dietary Approaches to Stop Hypertension." The DASH eating plan is a healthy eating plan that has been shown to reduce high blood pressure (hypertension). Additional health benefits may include reducing the risk of type 2 diabetes mellitus, heart disease, and stroke. The DASH eating plan may also help with weight loss. WHAT DO I  NEED TO KNOW ABOUT THE DASH EATING PLAN? For the DASH eating plan, you will follow these general guidelines:  Choose foods with a percent daily value for sodium of less than 5% (as listed on the food label).  Use salt-free seasonings or herbs instead of table salt or sea salt.  Check with your health care provider or pharmacist before using salt substitutes.  Eat lower-sodium products, often labeled as "lower sodium" or "no salt added."  Eat fresh foods.  Eat more vegetables, fruits, and low-fat dairy products.  Choose whole grains. Look for the word "whole" as the first word in the ingredient list.  Choose fish and skinless chicken or [place] more often than red meat. Limit fish, poultry, and meat to 6 oz (170 g) each day.  Limit sweets, desserts, sugars, and sugary drinks.  Choose heart-healthy fats.  Limit cheese to 1 oz (28 g) per day.  Eat more home-cooked food and less restaurant, buffet, and fast food.  Limit fried foods.  Cook foods using methods other than frying.  Limit  canned vegetables. If you do use them, rinse them well to decrease the sodium.  When eating at a restaurant, ask that your food be prepared with less salt, or no salt if possible. WHAT FOODS CAN I EAT? Seek help from a dietitian for individual calorie needs. Grains Whole grain or whole wheat bread. Brown rice. Whole grain or whole wheat pasta. Quinoa, bulgur, and whole grain cereals. Low-sodium cereals. Corn or whole wheat flour tortillas. Whole grain cornbread. Whole grain crackers. Low-sodium crackers. Vegetables Fresh or frozen vegetables (raw, steamed, roasted, or grilled). Low-sodium or reduced-sodium tomato and vegetable juices. Low-sodium or reduced-sodium tomato sauce and paste. Low-sodium or reduced-sodium canned vegetables.  Fruits All fresh, canned (in natural juice), or frozen fruits. Meat and Other Protein Products Ground beef (85% or leaner), grass-fed beef, or beef trimmed of fat. Skinless chicken or [place]. Ground chicken or [place]. Pork trimmed of fat. All fish and seafood. Eggs. Dried beans, peas, or lentils. Unsalted nuts and seeds. Unsalted canned beans. Dairy Low-fat dairy products, such as skim or 1% milk, 2% or reduced-fat cheeses, low-fat ricotta or cottage cheese, or plain low-fat yogurt. Low-sodium or reduced-sodium cheeses. Fats and Oils Tub margarines without trans fats. Light or reduced-fat mayonnaise and salad dressings (reduced sodium). Avocado. Safflower, olive, or canola oils. Natural peanut or almond butter. Other Unsalted popcorn and pretzels. The items listed above may not be a complete list of recommended foods or beverages. Contact your dietitian for more options. WHAT FOODS ARE NOT RECOMMENDED? Grains White bread. White pasta. White rice. Refined cornbread. Bagels and croissants. Crackers that contain trans fat. Vegetables Creamed or fried vegetables. Vegetables in a cheese sauce. Regular canned vegetables. Regular canned tomato sauce and paste. Regular  tomato and vegetable juices. Fruits Dried fruits. Canned fruit in light or heavy syrup. Fruit juice. Meat and Other Protein Products Fatty cuts of meat. Ribs, chicken wings, bacon, sausage, bologna, salami, chitterlings, fatback, hot dogs, bratwurst, and packaged luncheon meats. Salted nuts and seeds. Canned beans with salt. Dairy Whole or 2% milk, cream, half-and-half, and cream cheese. Whole-fat or sweetened yogurt. Full-fat cheeses or blue cheese. Nondairy creamers and whipped toppings. Processed cheese, cheese spreads, or cheese curds. Condiments Onion and garlic salt, seasoned salt, table salt, and sea salt. Canned and packaged gravies. Worcestershire sauce. Tartar sauce. Barbecue sauce. Teriyaki sauce. Soy sauce, including reduced sodium. Steak sauce. Fish sauce. Oyster sauce. Cocktail sauce. Horseradish. Ketchup and mustard. Meat flavorings  and tenderizers. Bouillon cubes. Hot sauce. Tabasco sauce. Marinades. Taco seasonings. Relishes. Fats and Oils Butter, stick margarine, lard, shortening, ghee, and bacon fat. Coconut, palm kernel, or palm oils. Regular salad dressings. Other Pickles and olives. Salted popcorn and pretzels. The items listed above may not be a complete list of foods and beverages to avoid. Contact your dietitian for more information. WHERE CAN I FIND MORE INFORMATION? National Heart, Lung, and Blood Institute: [employer]   This information is not intended to replace advice given to you by your health care provider. Make sure you discuss any questions you have with your health care provider.   Document Released: 07/12/2011 Document Revised: 08/13/2014 Document Reviewed: 05/27/2013 Elsevier Interactive Patient Education 2016 Elsevier Inc.  [facility] Policy for Prescribing Controlled Substances (Revised 06/2012) 1. Prescriptions for controlled substances will be filled by ONE provider at [facility] with whom you have established and  developed a plan for your care, including follow-up. 2. You are encouraged to schedule an appointment with your prescriber at our appointment center for follow-up visits whenever possible. 3. If you request a prescription for the controlled substance while at [facility] for an acute problem (with someone other than your regular prescriber), you MAY be given a ONE-TIME prescription for a 30-day supply of the controlled substance, to allow time for you to return to see your regular prescriber for additional prescriptions.

## 2015-12-06 NOTE — Progress Notes (Addendum)
By signing my name below, I, Timothy Lowe, attest that this documentation has been prepared under the direction and in the presence of Lesle Chris, MD.  Electronically Signed: Andrew Au, ED Scribe. 12/06/2015. 11:20 AM.   Chief Complaint:  Chief Complaint  Patient presents with  . Medication Refill    [name]    HPI: Timothy Lowe is a 50 y.o. male who reports to [facility] today for a medication refills regarding Norvasc, Hyzar, Ambien, Norvasc. There was a telephone note 11/01/15 from [facility] notifying me that pt is receiving prescriptions from another provider as well. Pt states he is also seen at [facility] which is where he receives prozac. Pt denies receiving any other prescriptions from another provider.  Pt is doing well. He denies CP and SOB.    pt has had elevated sugar and liver test. Hep B and Hep C are negative.  Past Medical History  Diagnosis Date  . GERD (gastroesophageal reflux disease)   . Depression   . Hypertension   . Hyperlipidemia   . Eczema   . Anxiety   . Insomnia   . Allergy    Past Surgical History  Procedure Laterality Date  . Hernia repair     Social History   Social History  . Marital Status: Single    Spouse Name: n/a  . Number of Children: 0  . Years of Education: 16   Occupational History  . [employer]    Social History Main Topics  . Smoking status: Never Smoker   . Smokeless tobacco: Former Neurosurgeon    Types: Chew     Comment: used chew in college  . Alcohol Use: 0.0 - 0.6 oz/week    0-1 Glasses of wine per week     Comment: 1 glass with dinner 2-3 times/month  . Drug Use: No  . Sexual Activity:    Partners: Female   Other Topics Concern  . None   Social History Narrative   Lives alone. His parents live in [place], [place]                     Family History  Problem Relation Age of Onset  . Diabetes Sister    No Known Allergies Prior to Admission medications   Medication Sig Start  Date End Date Taking? Authorizing Provider  amLODipine (NORVASC) 5 MG tablet Take 1 tablet (5 mg total) by mouth daily. 05/18/15  Yes Collene Gobble, MD  FLUoxetine (PROZAC) 20 MG tablet Three tablets daily 10/22/14  Yes Collene Gobble, MD  ibuprofen (ADVIL,MOTRIN) 600 MG tablet Take 1 tablet (600 mg total) by mouth every 6 (six) hours as needed for pain. 11/11/12  Yes Renne Crigler, PA-C  LORazepam (ATIVAN) 1 MG tablet Take 1 tablet (1 mg total) by mouth 2 (two) times daily as needed. 05/18/15  Yes Collene Gobble, MD  losartan-hydrochlorothiazide (HYZAAR) 100-12.5 MG tablet Take 1 tablet by mouth daily. 05/18/15  Yes Collene Gobble, MD  omeprazole (PRILOSEC) 20 MG capsule Take 1 capsule (20 mg total) by mouth daily. 10/30/13  Yes Carmelina Dane, MD  zolpidem (AMBIEN) 10 MG tablet Take 1 tablet (10 mg total) by mouth at bedtime. 05/18/15  Yes Collene Gobble, MD     ROS: The patient denies fevers, chills, night sweats, unintentional weight loss, chest pain, palpitations, wheezing, dyspnea on exertion, nausea, vomiting, abdominal pain, dysuria, hematuria, melena, numbness, weakness, or tingling.   All other systems  have been reviewed and were otherwise negative with the exception of those mentioned in the HPI and as above.    PHYSICAL EXAM: Filed Vitals:   12/06/15 1116  BP: 116/86  Pulse: 67  Temp: 98.2 F (36.8 C)  Resp: 16   Body mass index is 32.65 kg/(m^2).   General: Alert, no acute distress HEENT:  Normocephalic, atraumatic, oropharynx patent. [facility] Cardiovascular:  Regular rate and rhythm, no rubs murmurs or gallops.  No Carotid bruits, radial pulse intact. No pedal edema.  Respiratory: Clear to auscultation bilaterally.  No wheezes, rales, or rhonchi.  No cyanosis, no use of accessory musculature Abdominal: No organomegaly, abdomen is soft and non-tender, positive bowel sounds.  No masses. Musculoskeletal: Gait intact. No edema, tenderness Skin: No  rashes. Neurologic: Facial musculature symmetric. Psychiatric: Patient acts appropriately throughout our interaction. Lymphatic: No cervical or submandibular lymphadenopathy    LABS: Results for orders placed or performed in visit on 12/06/15  POCT CBC  Result Value Ref Range   WBC 9.0 4.6 - 10.2 K/uL   Lymph, poc 3.2 0.6 - 3.4   POC LYMPH PERCENT 35.0 10 - 50 %L   MID (cbc) 0.8 0 - 0.9   POC MID % 8.7 0 - 12 %M   POC Granulocyte 5.1 2 - 6.9   Granulocyte percent 56.3 37 - 80 %G   RBC 5.47 4.69 - 6.13 M/uL   Hemoglobin 17.9 14.1 - 18.1 g/dL   HCT, POC 78.249.8 95.643.5 - 53.7 %   MCV 91.2 80 - 97 fL   MCH, POC 32.7 (A) 27 - 31.2 pg   MCHC 35.9 (A) 31.8 - 35.4 g/dL   RDW, POC 21.312.5 %   Platelet Count, POC 312 142 - 424 K/uL   MPV 7.5 0 - 99.8 fL  POCT glucose (manual entry)  Result Value Ref Range   POC Glucose 244 (A) 70 - 99 mg/dl  POCT glycosylated hemoglobin (Hb A1C)  Result Value Ref Range   Hemoglobin A1C 11.3      EKG/XRAY:   Primary read [facility]   ASSESSMENT/PLAN: Patient  diabetic with an A1c of 11.3 sugar 244.This patient has had an elevated sugar and A1c since 2014. When he was seen at that time he was given information about diabetes started on a diet and informed if his sugar did not improve in the next 3 months he would need to start on Glucophage. He seemed surprised when I told him he was diabetic. I told him we had asked him to call last year to review his lab work and sent the letter but he says he did not receive any calls or let her. I told him he also had labs placed in the fall of the year to return and have blood work done but that was never done. He is started on metformin twice a day. He will take a baby aspirin daily. His blood pressure medication will continue. Has started him on Lipitor 20 one a day. I told him I could not refill his Ambien he would have to get [facility]. I signed a contract again regarding his Ativan Patient denied that he was getting prescriptions from 2 providers. I pulled up the drug data sheet and showed him where he was receiving Ambien prescriptions from our office and [facility]. He then admitted he would take 2  Ambien at night. Prior to this he adamantly denied that he was doing so.Marland KitchenMarland KitchenI personally performed the services described in this documentation, which was scribed in my presence. The recorded information has been reviewed and is accurate. Liver function tests are elevated. I have ordered additional tests. Referral made to Dr. Audley Hose. Hemoglobin was elevated so DNA test for hemochromatosis was done along with a ferritin level. I also ordered a CK. Patient advised to abstain from alcohol and Tylenol.I personally performed the services described in this documentation, which was scribed in my presence. The recorded information has been reviewed and is accurate. I discussed case with the pharmacist. Patient knows he must stop the metformin. He will be on Glucotrol XL 10 one a day this was sent to the pharmacy he will recheck in our office in one month.I personally performed the services described in this documentation, which was scribed in my presence. The recorded information has been reviewed and is accurate.   Gross sideeffects, risk and benefits, and alternatives of medications d/w patient. Patient is aware that all medications have potential sideeffects and we are unable to predict every sideeffect or drug-drug interaction that may occur.  Lesle Chris MD 12/06/2015 11:19 AM

## 2015-12-07 ENCOUNTER — Other Ambulatory Visit: Payer: Self-pay | Admitting: Emergency Medicine

## 2015-12-07 ENCOUNTER — Telehealth: Payer: Self-pay | Admitting: Emergency Medicine

## 2015-12-07 DIAGNOSIS — E118 Type 2 diabetes mellitus with unspecified complications: Secondary | ICD-10-CM

## 2015-12-07 DIAGNOSIS — R945 Abnormal results of liver function studies: Secondary | ICD-10-CM

## 2015-12-07 DIAGNOSIS — E1165 Type 2 diabetes mellitus with hyperglycemia: Secondary | ICD-10-CM

## 2015-12-07 DIAGNOSIS — R7989 Other specified abnormal findings of blood chemistry: Secondary | ICD-10-CM

## 2015-12-07 DIAGNOSIS — IMO0002 Reserved for concepts with insufficient information to code with codable children: Secondary | ICD-10-CM

## 2015-12-07 MED ORDER — GLIPIZIDE ER 10 MG PO TB24
10.0000 mg | ORAL_TABLET | Freq: Every day | ORAL | Status: DC
Start: 2015-12-07 — End: 2016-05-25

## 2015-12-07 NOTE — Telephone Encounter (Signed)
I spoke with patient and pharmacist. He will be on glucotrol XL 10 and stop the metformen.

## 2015-12-09 ENCOUNTER — Telehealth: Payer: Self-pay

## 2015-12-09 NOTE — Telephone Encounter (Signed)
Spoke with pt, advised message from Dr. Shaw. 

## 2015-12-09 NOTE — Telephone Encounter (Signed)
Dr. Belva Bertinaub-pt states he will not be able to [facility] because it is too expensive and he does not have insurance. He just wanted to let you know.

## 2015-12-09 NOTE — Telephone Encounter (Signed)
The [facility].  He said he was not expecting this referral, and he did not know about his elevated LFTs.  Please contact the patient to discuss this labwork.  He said he would schedule an appointment once he understood why it was necessary.  CB#: [phone]

## 2015-12-09 NOTE — Telephone Encounter (Signed)
Dr Daub? 

## 2015-12-10 ENCOUNTER — Telehealth: Payer: Self-pay | Admitting: Emergency Medicine

## 2015-12-10 NOTE — Telephone Encounter (Signed)
Message left on answering machine. Patient advised I understand why he could not see the GI specialist. Advised him to be sure he took his medications as instructed and follow-up as instructed.

## 2015-12-10 NOTE — Telephone Encounter (Signed)
I call patient I left a message on his answering machine that I understood why he could not see the GI specialist. I advised him to be sure he took his medication as instructed and follows up in a month.

## 2015-12-21 ENCOUNTER — Telehealth: Payer: Self-pay

## 2015-12-21 NOTE — Telephone Encounter (Signed)
Pt has questions about new meds he is taking and whether he needs to follow up about his BP.  Please advise  [phone]

## 2015-12-22 NOTE — Telephone Encounter (Signed)
Pt spoke with someone yesterday.

## 2015-12-28 ENCOUNTER — Emergency Department (HOSPITAL_COMMUNITY)
Admission: EM | Admit: 2015-12-28 | Discharge: 2015-12-28 | Disposition: A | Discharge status: Home / Self Care | Payer: Self-pay | Attending: Emergency Medicine | Admitting: Emergency Medicine

## 2015-12-28 ENCOUNTER — Emergency Department (HOSPITAL_COMMUNITY): Payer: Self-pay

## 2015-12-28 ENCOUNTER — Encounter (HOSPITAL_COMMUNITY): Payer: Self-pay | Admitting: Emergency Medicine

## 2015-12-28 DIAGNOSIS — S61011A Laceration without foreign body of right thumb without damage to nail, initial encounter: Secondary | ICD-10-CM | POA: Insufficient documentation

## 2015-12-28 DIAGNOSIS — S1093XA Contusion of unspecified part of neck, initial encounter: Secondary | ICD-10-CM | POA: Insufficient documentation

## 2015-12-28 DIAGNOSIS — Z23 Encounter for immunization: Secondary | ICD-10-CM | POA: Insufficient documentation

## 2015-12-28 DIAGNOSIS — S1091XA Abrasion of unspecified part of neck, initial encounter: Secondary | ICD-10-CM | POA: Insufficient documentation

## 2015-12-28 DIAGNOSIS — Z7984 Long term (current) use of oral hypoglycemic drugs: Secondary | ICD-10-CM | POA: Insufficient documentation

## 2015-12-28 DIAGNOSIS — I10 Essential (primary) hypertension: Secondary | ICD-10-CM | POA: Insufficient documentation

## 2015-12-28 DIAGNOSIS — Y998 Other external cause status: Secondary | ICD-10-CM | POA: Insufficient documentation

## 2015-12-28 DIAGNOSIS — Y92038 Other place in apartment as the place of occurrence of the external cause: Secondary | ICD-10-CM | POA: Insufficient documentation

## 2015-12-28 DIAGNOSIS — F419 Anxiety disorder, unspecified: Secondary | ICD-10-CM | POA: Insufficient documentation

## 2015-12-28 DIAGNOSIS — F329 Major depressive disorder, single episode, unspecified: Secondary | ICD-10-CM | POA: Insufficient documentation

## 2015-12-28 DIAGNOSIS — S61212A Laceration without foreign body of right middle finger without damage to nail, initial encounter: Secondary | ICD-10-CM | POA: Insufficient documentation

## 2015-12-28 DIAGNOSIS — K219 Gastro-esophageal reflux disease without esophagitis: Secondary | ICD-10-CM | POA: Insufficient documentation

## 2015-12-28 DIAGNOSIS — G4700 Insomnia, unspecified: Secondary | ICD-10-CM | POA: Insufficient documentation

## 2015-12-28 DIAGNOSIS — Z872 Personal history of diseases of the skin and subcutaneous tissue: Secondary | ICD-10-CM | POA: Insufficient documentation

## 2015-12-28 DIAGNOSIS — Z79899 Other long term (current) drug therapy: Secondary | ICD-10-CM | POA: Insufficient documentation

## 2015-12-28 DIAGNOSIS — S61210A Laceration without foreign body of right index finger without damage to nail, initial encounter: Secondary | ICD-10-CM | POA: Insufficient documentation

## 2015-12-28 DIAGNOSIS — Y9389 Activity, other specified: Secondary | ICD-10-CM | POA: Insufficient documentation

## 2015-12-28 DIAGNOSIS — S61219A Laceration without foreign body of unspecified finger without damage to nail, initial encounter: Secondary | ICD-10-CM

## 2015-12-28 DIAGNOSIS — S61209A Unspecified open wound of unspecified finger without damage to nail, initial encounter: Secondary | ICD-10-CM

## 2015-12-28 MED ORDER — TRAMADOL HCL 50 MG PO TABS
50.0000 mg | ORAL_TABLET | Freq: Once | ORAL | Status: AC
  Administered 2015-12-28: 50 mg via ORAL
  Filled 2015-12-28: qty 1

## 2015-12-28 MED ORDER — CEPHALEXIN 500 MG PO CAPS: 500.0000 mg | ORAL_CAPSULE | Freq: Four times a day (QID) | ORAL | Status: DC

## 2015-12-28 MED ORDER — TRAMADOL HCL 50 MG PO TABS: 50.0000 mg | ORAL_TABLET | Freq: Four times a day (QID) | ORAL | Status: AC | PRN

## 2015-12-28 MED ORDER — BUPIVACAINE HCL (PF) 0.5 % IJ SOLN
10.0000 mL | Freq: Once | INTRAMUSCULAR | Status: AC
  Administered 2015-12-28: 10 mL
  Filled 2015-12-28: qty 10

## 2015-12-28 MED ORDER — TETANUS-DIPHTH-ACELL PERTUSSIS 5-2.5-18.5 LF-MCG/0.5 IM SUSP
0.5000 mL | Freq: Once | INTRAMUSCULAR | Status: AC
  Administered 2015-12-28: 0.5 mL via INTRAMUSCULAR
  Filled 2015-12-28: qty 0.5

## 2015-12-28 NOTE — Discharge Instructions (Signed)
Deep Skin Avulsion A deep skin avulsion is a type of open wound. It often results from a severe injury (trauma) that tears away all layers of the skin or an entire body part. The areas of the body that are most often affected by a deep skin avulsion include the face, lips, ears, nose, and fingers. A deep skin avulsion may make structures below the skin become visible. You may be able to see muscle, bone, nerves, and blood vessels. A deep skin avulsion can also damage important structures beneath the skin. These include tendons, ligaments, nerves, or blood vessels. CAUSES Injuries that often cause a deep skin avulsion include:  Being crushed.  Falling against a jagged surface.  Animal bites.  Gunshot wounds.  Severe burns.  Injuries that involve being dragged, such as bicycle or motorcycle accidents. SYMPTOMS Symptoms of a deep skin avulsion include:  Pain.  Numbness.  Swelling.  A misshapen body part.  Bleeding, which may be heavy.  Fluid leaking from the wound. DIAGNOSIS This condition may be diagnosed with a medical history and physical exam. You may also have X-rays done. TREATMENT The treatment that is chosen for a deep skin avulsion depends on how large and deep the wound is and where it is located. Treatment for all types of avulsions usually starts with:  Controlling the bleeding.  Washing out the wound with a germ-free (sterile) salt-water solution.  Removing dead tissue from the wound. A wound may be closed or left open to heal. This depends on the size and location of the wound and whether it is likely to become infected. Wounds are usually covered or closed if they expose blood vessels, nerves, bone, or cartilage.  Wounds that are small and clean may be closed with stitches (sutures).  Wounds that cannot be closed with sutures may be covered with a piece of skin (graft) or a skin flap. Skin may be taken from on or near the wound, from another part of the body,  or from a donor.  Wounds may be left open if they are hard to close or they may become infected. These wounds heal over time from the bottom up. You may also receive medicine. This may include:  Antibiotics.  A tetanus shot.  Rabies vaccine. HOME CARE INSTRUCTIONS Medicines  Take or apply over-the-counter and prescription medicines only as told by your health care provider.  If you were prescribed an antibiotic, take or apply it as told by your health care provider. Do not stop taking the antibiotic even if your condition improves.  You may get anti-itch medicine while your wound is healing. Use it only as told by your health care provider. Wound Care  There are many ways to close and cover a wound. For example, a wound can be covered with sutures, skin glue, or adhesive strips. Follow instructions from your health care provider about:  How to take care of your wound.  When and how you should change your bandage (dressing).  When you should remove your dressing.  Removing whatever was used to close your wound.  Keep the dressing dry as told by your health care provider. Do not take baths, swim, use a hot tub, or do anything that would put your wound underwater until your health care provider approves.  Clean the wound each day or as told by your health care provider.  Wash the wound with mild soap and water.  Rinse the wound with water to remove all soap.  Pat the wound   dry with a clean towel. Do not rub it.  Do not scratch or pick at the wound.  Check your wound every day for signs of infection. Watch for:  Redness, swelling, or pain.  Fluid, blood, or pus. General Instructions  Raise (elevate) the injured area above the level of your heart while you are sitting or lying down.  Keep all follow-up visits as told by your health care provider. This is important. SEEK MEDICAL CARE IF:  You received a tetanus shot and you have swelling, severe pain, redness, or  bleeding at the injection site.  You have a fever.  Your pain is not controlled with medicine.  You have increased redness, swelling, or pain at the site of your wound.  You have fluid, blood, or pus coming from your wound.  You notice a bad smell coming from your wound or your dressing.  A wound that was closed breaks open.  You notice something coming out of the wound, such as wood or glass.  You notice a change in the color of your skin near your wound.  You develop a new rash.  You need to change the dressing frequently due to fluid, blood, or pus draining from the wound. SEEK IMMEDIATE MEDICAL CARE IF:  Your pain suddenly increases and is severe.  You develop severe swelling around the wound.  You develop numbness around the wound.  You have nausea and vomiting that does not go away after 24 hours.  You feel light-headed, weak, or faint.  You develop chest pain.  You have trouble breathing.  Your wound is on your hand or foot and you cannot properly move a finger or toe.  The wound is on your hand or foot and you notice that your fingers or toes look pale or bluish.  You have a red streak going away from your wound.   This information is not intended to replace advice given to you by your health care provider. Make sure you discuss any questions you have with your health care provider.   Document Released: 09/18/2006 Document Revised: 12/07/2014 Document Reviewed: 07/28/2014 Elsevier Interactive Patient Education 2016 [employer]  

## 2015-12-28 NOTE — ED Notes (Signed)
Pt transported to xray 
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
## 2015-12-28 NOTE — ED Notes (Signed)
Pt returned to room TR05 from xray
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
## 2016-01-08 ENCOUNTER — Encounter (HOSPITAL_COMMUNITY): Payer: Self-pay

## 2016-01-08 ENCOUNTER — Emergency Department (HOSPITAL_COMMUNITY)
Admission: EM | Admit: 2016-01-08 | Discharge: 2016-01-08 | Disposition: A | Discharge status: Home / Self Care | Payer: Self-pay | Attending: Emergency Medicine | Admitting: Emergency Medicine

## 2016-01-08 DIAGNOSIS — Z79899 Other long term (current) drug therapy: Secondary | ICD-10-CM | POA: Insufficient documentation

## 2016-01-08 DIAGNOSIS — I10 Essential (primary) hypertension: Secondary | ICD-10-CM | POA: Insufficient documentation

## 2016-01-08 DIAGNOSIS — Z4802 Encounter for removal of sutures: Secondary | ICD-10-CM | POA: Insufficient documentation

## 2016-01-08 NOTE — ED Notes (Signed)
Declined W/C at D/C and was escorted to lobby by RN. 

## 2016-01-08 NOTE — Discharge Instructions (Signed)
Mr. Timothy Lowe,  Nice meeting you! Please follow-up with your primary care provider. Return to the emergency department if you develop fevers, chills, yellow/green drainage, inability to move your finger, numbness/tingling, new/worsening symptoms. Feel better soon!  S. Lane HackerNicole Elianie Hubers, PA-C Suture Removal, Care After Refer to this sheet in the next few weeks. These instructions provide you with information on caring for yourself after your procedure. Your health care provider may also give you more specific instructions. Your treatment has been planned according to current medical practices, but problems sometimes occur. Call your health care provider if you have any problems or questions after your procedure. WHAT TO EXPECT AFTER THE PROCEDURE After your stitches (sutures) are removed, it is typical to have the following:  Some discomfort and swelling in the wound area.  Slight redness in the area. HOME CARE INSTRUCTIONS   If you have skin adhesive strips over the wound area, do not take the strips off. They will fall off on their own in a few days. If the strips remain in place after 14 days, you may remove them.  Change any bandages (dressings) at least once a day or as directed by your health care provider. If the bandage sticks, soak it off with warm, soapy water.  Apply cream or ointment only as directed by your health care provider. If using cream or ointment, wash the area with soap and water 2 times a day to remove all the cream or ointment. Rinse off the soap and pat the area dry with a clean towel.  Keep the wound area dry and clean. If the bandage becomes wet or dirty, or if it develops a bad smell, change it as soon as possible.  Continue to protect the wound from injury.  Use sunscreen when out in the sun. New scars become sunburned easily. SEEK MEDICAL CARE IF:  You have increasing redness, swelling, or pain in the wound.  You see pus coming from the wound.  You  have a fever.  You notice a bad smell coming from the wound or dressing.  Your wound breaks open (edges not staying together).   This information is not intended to replace advice given to you by your health care provider. Make sure you discuss any questions you have with your health care provider.   Document Released: 04/17/2001 Document Revised: 05/13/2013 Document Reviewed: 03/04/2013 Elsevier Interactive Patient Education [employer]2016 Elsevier Inc.

## 2016-01-08 NOTE — ED Provider Notes (Signed)
CSN: [id number]     Arrival date & time 01/08/16  1411 History   First MD Initiated Contact with Patient 01/08/16 1511     Chief Complaint  Patient presents with  . Suture / Staple Removal   HPI  Timothy Lowe is a 50 y.o. male PMH significant for GERD, depression, anxiety, HTN, HLD presenting with a request for suture removal. He was seen on 12/29/15 for suture placement. He denies fevers, chills, purulent drainage, inability to move his fingers, numbness/tingling, nausea/vomiting.   Past Medical History  Diagnosis Date  . GERD (gastroesophageal reflux disease)   . Depression   . Hypertension   . Hyperlipidemia   . Eczema   . Anxiety   . Insomnia   . Allergy    Past Surgical History  Procedure Laterality Date  . Hernia repair     Family History  Problem Relation Age of Onset  . Diabetes Sister    Social History  Substance Use Topics  . Smoking status: Never Smoker   . Smokeless tobacco: Former Neurosurgeon    Types: Chew     Comment: used chew in college  . Alcohol Use: 0.0 - 0.6 oz/week    0-1 Glasses of wine per week     Comment: 1 glass with dinner 2-3 times/month    Review of Systems  Ten systems are reviewed and are negative for acute change except as noted in the HPI  Allergies  Review of patient's allergies indicates no known allergies.  Home Medications   Prior to Admission medications   Medication Sig Start Date End Date Taking? Authorizing Provider  amLODipine (NORVASC) 5 MG tablet Take 1 tablet (5 mg total) by mouth daily. 12/06/15   Collene Gobble, MD  cephALEXin (KEFLEX) 500 MG capsule Take 1 capsule (500 mg total) by mouth 4 (four) times daily. 12/28/15   Burgess Amor, PA-C  FLUoxetine (PROZAC) 20 MG tablet Three tablets daily 10/22/14   Collene Gobble, MD  glipiZIDE (GLUCOTROL XL) 10 MG 24 hr tablet Take 1 tablet (10 mg total) by mouth daily with breakfast. 12/07/15   Collene Gobble, MD  ibuprofen (ADVIL,MOTRIN) 600 MG tablet Take 1 tablet (600 mg total) by mouth  every 6 (six) hours as needed for pain. 11/11/12   Renne Crigler, PA-C  LORazepam (ATIVAN) 1 MG tablet Take 1 tablet twice a day. He may refill this every 30 days. 12/06/15   Collene Gobble, MD  losartan-hydrochlorothiazide (HYZAAR) 100-12.5 MG tablet Take 1 tablet by mouth daily. 12/06/15   Collene Gobble, MD  metFORMIN (GLUCOPHAGE) 500 MG tablet Take 1 tablet (500 mg total) by mouth 2 (two) times daily with a meal. 12/06/15   Collene Gobble, MD  omeprazole (PRILOSEC) 20 MG capsule Take 1 capsule (20 mg total) by mouth daily. 10/30/13   Carmelina Dane, MD  traMADol (ULTRAM) 50 MG tablet Take 1 tablet (50 mg total) by mouth every 6 (six) hours as needed. 12/28/15   Burgess Amor, PA-C  zolpidem (AMBIEN) 10 MG tablet Take 1 tablet (10 mg total) by mouth at bedtime. 05/18/15   Collene Gobble, MD   BP 141/98 mmHg  Pulse 76  Temp(Src) 99 F (37.2 C)  Resp 18  SpO2 100% Physical Exam  Constitutional: He appears well-developed and well-nourished.  HENT:  Head: Normocephalic and atraumatic.  Eyes: Conjunctivae are normal.  Neck: Normal range of motion.  Cardiovascular: Normal rate and intact distal pulses.   Pulmonary/Chest: Effort normal.  Abdominal: Soft.  Musculoskeletal: Normal range of motion. He exhibits no edema or tenderness.  Neurovascularly intact BL UE.   Neurological: He is alert. Coordination normal.  Skin: Skin is warm and dry.  0.5 cm well healing lacerations to the right distal volar thumb and index finger with sutures in place (3 on right distal thumb and 2 sutures on right index finger). No erythema, purulent drainage.   Psychiatric: He has a normal mood and affect.    ED Course  .Suture Removal Date/Time: 01/08/2016 3:00 PM Performed by: Althea GrimmerILEY, Conita Amenta NICOLE Authorized by: Melton KrebsILEY, Orlena Garmon NICOLE Consent: Verbal consent obtained. Risks and benefits: risks, benefits and alternatives were discussed Consent given by: patient Patient understanding: patient states understanding of the  procedure being performed Patient identity confirmed: verbally with patient and arm band Body area: upper extremity (Right distal volar thumb and index finger) Wound Appearance: clean Sutures Removed: 5 Post-removal: dressing applied and antibiotic ointment applied Facility: sutures placed in this facility Patient tolerance: Patient tolerated the procedure well with no immediate complications    MDM   Final diagnoses:  Visit for suture removal   Pt to ER for staple/suture removal and wound check as above. 5 sutures removed. Procedure tolerated well. Vitals normal, no signs of infection. Patient may be safely discharged home. Discussed reasons for return. Patient to follow-up with primary care provider as needed. Patient in understanding and agreement with the plan.   Melton KrebsSamantha Nicole Drew Herman, PA-C 01/19/16 0756  Melene Planan Floyd, DO 01/19/16 1600

## 2016-01-08 NOTE — ED Notes (Signed)
Patient here to ave stitches removed from right hand, in place x 10 days

## 2016-04-24 ENCOUNTER — Telehealth: Payer: Self-pay

## 2016-04-24 NOTE — Telephone Encounter (Signed)
Patient would like advising regarding donating plasma. As a diabetic, he wasn't sure if we would suggest that this is okay for him to do. Please advise.  [phone]

## 2016-04-26 NOTE — Telephone Encounter (Signed)
I am not sure what the rules are at the blood bank regarding medical conditions that may disqualify a person from donating plasma. He can contact the blood bank or check online to see.  With regard to his own health, I would advise against plasma donation until his diabetes is under control. At his visit in May, his A1C was 11.3%. I recommend that he go ahead and come in to see a new person (since Dr. Jefm Pettyaub retires 05/06/2016) and see where things are and make any needed changes.

## 2016-05-25 ENCOUNTER — Encounter: Payer: Self-pay | Admitting: Family Medicine

## 2016-05-25 ENCOUNTER — Ambulatory Visit (INDEPENDENT_AMBULATORY_CARE_PROVIDER_SITE_OTHER): Payer: No Typology Code available for payment source | Admitting: Family Medicine

## 2016-05-25 VITALS — BP 147/99 | HR 72 | Temp 97.8°F | Resp 18 | Ht 72.0 in | Wt 251.0 lb

## 2016-05-25 DIAGNOSIS — I10 Essential (primary) hypertension: Secondary | ICD-10-CM

## 2016-05-25 DIAGNOSIS — E118 Type 2 diabetes mellitus with unspecified complications: Secondary | ICD-10-CM

## 2016-05-25 DIAGNOSIS — F411 Generalized anxiety disorder: Secondary | ICD-10-CM

## 2016-05-25 DIAGNOSIS — K089 Disorder of teeth and supporting structures, unspecified: Secondary | ICD-10-CM

## 2016-05-25 DIAGNOSIS — IMO0002 Reserved for concepts with insufficient information to code with codable children: Secondary | ICD-10-CM

## 2016-05-25 DIAGNOSIS — E1165 Type 2 diabetes mellitus with hyperglycemia: Secondary | ICD-10-CM

## 2016-05-25 DIAGNOSIS — G8929 Other chronic pain: Secondary | ICD-10-CM

## 2016-05-25 DIAGNOSIS — Z114 Encounter for screening for human immunodeficiency virus [HIV]: Secondary | ICD-10-CM

## 2016-05-25 DIAGNOSIS — E119 Type 2 diabetes mellitus without complications: Secondary | ICD-10-CM

## 2016-05-25 LAB — COMPLETE METABOLIC PANEL WITH GFR
ALBUMIN: 4.2 g/dL (ref 3.6–5.1)
ALK PHOS: 71 U/L (ref 40–115)
ALT: 163 U/L — ABNORMAL HIGH (ref 9–46)
AST: 147 U/L — AB (ref 10–40)
BILIRUBIN TOTAL: 0.8 mg/dL (ref 0.2–1.2)
BUN: 10 mg/dL (ref 7–25)
CALCIUM: 9.1 mg/dL (ref 8.6–10.3)
CO2: 26 mmol/L (ref 20–31)
CREATININE: 1.03 mg/dL (ref 0.60–1.35)
Chloride: 99 mmol/L (ref 98–110)
GFR, Est African American: >89 mL/min (ref >=60)
GFR, Est Non African American: 85 mL/min (ref >=60)
Glucose, Bld: 288 mg/dL — ABNORMAL HIGH (ref 65–99)
POTASSIUM: 4.2 mmol/L (ref 3.5–5.3)
Sodium: 136 mmol/L (ref 135–146)
TOTAL PROTEIN: 7.1 g/dL (ref 6.1–8.1)

## 2016-05-25 LAB — LIPID PANEL
CHOL/HDL RATIO: 4.7 ratio (ref <=5.0)
CHOLESTEROL: 241 mg/dL — AB (ref 125–200)
HDL: 51 mg/dL (ref >=40)
LDL Cholesterol: 148 mg/dL — ABNORMAL HIGH (ref <130)
Triglycerides: 208 mg/dL — ABNORMAL HIGH (ref <150)
VLDL: 42 mg/dL — ABNORMAL HIGH (ref <30)

## 2016-05-25 LAB — CBC WITH DIFFERENTIAL/PLATELET
BASOS ABS: 67 {cells}/uL (ref 0–200)
Basophils Relative: 1 %
Eosinophils Absolute: 268 cells/uL (ref 15–500)
Eosinophils Relative: 4 %
HEMATOCRIT: 44.7 % (ref 38.5–50.0)
HEMOGLOBIN: 15.8 g/dL (ref 13.2–17.1)
LYMPHS ABS: 1541 {cells}/uL (ref 850–3900)
Lymphocytes Relative: 23 %
MCH: 33.5 pg — ABNORMAL HIGH (ref 27.0–33.0)
MCHC: 35.3 g/dL (ref 32.0–36.0)
MCV: 94.7 fL (ref 80.0–100.0)
MONO ABS: 670 {cells}/uL (ref 200–950)
MPV: 9.8 fL (ref 7.5–12.5)
Monocytes Relative: 10 %
NEUTROS PCT: 62 %
Neutro Abs: 4154 cells/uL (ref 1500–7800)
Platelets: 257 10*3/uL (ref 140–400)
RBC: 4.72 MIL/uL (ref 4.20–5.80)
RDW: 13.3 % (ref 11.0–15.0)
WBC: 6.7 10*3/uL (ref 3.8–10.8)

## 2016-05-25 LAB — POCT GLYCOSYLATED HEMOGLOBIN (HGB A1C): HEMOGLOBIN A1C: 8.2

## 2016-05-25 MED ORDER — AMLODIPINE BESYLATE 5 MG PO TABS: 5.0000 mg | ORAL_TABLET | Freq: Every day | ORAL | 1 refills | Status: AC

## 2016-05-25 MED ORDER — AMLODIPINE BESYLATE 5 MG PO TABS: 5.0000 mg | ORAL_TABLET | Freq: Every day | ORAL | 3 refills | Status: DC

## 2016-05-25 MED ORDER — LOSARTAN POTASSIUM-HCTZ 100-12.5 MG PO TABS: 1.0000 | ORAL_TABLET | Freq: Every day | ORAL | 1 refills | Status: AC

## 2016-05-25 MED ORDER — GLIPIZIDE ER 10 MG PO TB24: 10.0000 mg | ORAL_TABLET | Freq: Every day | ORAL | 1 refills | Status: AC

## 2016-05-25 MED ORDER — LORAZEPAM 1 MG PO TABS: ORAL_TABLET | ORAL | 0 refills | Status: AC

## 2016-05-25 MED ORDER — CEPHALEXIN 500 MG PO CAPS: 500.0000 mg | ORAL_CAPSULE | Freq: Four times a day (QID) | ORAL | 0 refills | Status: AC

## 2016-05-25 MED ORDER — METFORMIN HCL 1000 MG PO TABS: 1000.0000 mg | ORAL_TABLET | Freq: Two times a day (BID) | ORAL | 3 refills | Status: AC

## 2016-05-25 MED ORDER — GLIPIZIDE ER 10 MG PO TB24: 10.0000 mg | ORAL_TABLET | Freq: Every day | ORAL | 3 refills | Status: DC

## 2016-05-25 MED ORDER — CEPHALEXIN 500 MG PO CAPS: 500.0000 mg | ORAL_CAPSULE | Freq: Four times a day (QID) | ORAL | 0 refills | Status: DC

## 2016-05-25 MED ORDER — OMEPRAZOLE 20 MG PO CPDR: 20.0000 mg | DELAYED_RELEASE_CAPSULE | Freq: Every day | ORAL | 5 refills | Status: AC

## 2016-05-25 MED ORDER — LOSARTAN POTASSIUM-HCTZ 100-12.5 MG PO TABS: 1.0000 | ORAL_TABLET | Freq: Every day | ORAL | 3 refills | Status: DC

## 2016-05-25 NOTE — Patient Instructions (Signed)
Continue currently medications. Will let you know if anything in bloodwork requires attention.

## 2016-05-25 NOTE — Progress Notes (Signed)
Timothy Lowe, is a 50 y.o. male  EAV:[id number]  NWG:[id number]  DOB - 1966/03/07  CC:  Chief Complaint  Patient presents with  . Establish Care  . Dental Pain    wisdom tooth pain   . Medication Refill       HPI: Timothy Lowe is a 50 y.o. male here to establish care. He has recently loss his insurance and has received an orange card. His diagnoses include allergies, anxiety/depression, eczema, diabetes, GERd, hyperlipidemia, hypertension and insomnia.  He is being followed by [facility] but receiving Ativan and Ambien from his PCP. He has been receiving Prozac from [facility]. He is on glipizide 10 and metformin 1000 for diabetes, losartan and amlodipine for hypertension. He is on Prilosec for GERD. His A1C in may was 11.2, today is 8.3. He is not currently on a statin. He reports dental pain at present and request a dental referral. He reports taking 800 mg of ibuprofen 4 times a day.  He reports staying away from sweets and fried foods, gets a little exercise.   No Known Allergies Past Medical History:  Diagnosis Date  . Allergy   . Anxiety   . Depression   . Eczema   . GERD (gastroesophageal reflux disease)   . Hyperlipidemia   . Hypertension   . Insomnia    Current Outpatient Prescriptions on File Prior to Visit  Medication Sig Dispense Refill  . FLUoxetine (PROZAC) 20 MG tablet Three tablets daily 90 tablet 5  . ibuprofen (ADVIL,MOTRIN) 600 MG tablet Take 1 tablet (600 mg total) by mouth every 6 (six) hours as needed for pain. 20 tablet 0  . traMADol (ULTRAM) 50 MG tablet Take 1 tablet (50 mg total) by mouth every 6 (six) hours as needed. 15 tablet 0  . zolpidem (AMBIEN) 10 MG tablet Take 1 tablet (10 mg total) by mouth at bedtime. 30 tablet 5   No current facility-administered medications on file prior to visit.    Family History  Problem Relation Age of Onset  . Diabetes Sister    Social History   Social History  . Marital status: Single    Spouse name: n/a   . Number of children: 0  . Years of education: 62   Occupational History  . [employer] Marketing [employer]   Social History Main Topics  . Smoking status: Never Smoker  . Smokeless tobacco: Former Neurosurgeon    Types: Chew     Comment: used chew in college  . Alcohol use 0.0 - 0.6 oz/week     Comment: 1 glass with dinner 2-3 times/month  . Drug use: No  . Sexual activity: Yes    Partners: Female   Other Topics Concern  . Not on file   Social History Narrative   Lives alone. His parents live in [place], [place]                      Review of Systems: Constitutional: Negative Skin: Negative HENT: Negative  Eyes: Negative  Neck: Negative Respiratory: Negative Cardiovascular: Negative Gastrointestinal: Negative Genitourinary: Negative  Musculoskeletal: Negative   Neurological: Negative for Hematological: Negative  Psychiatric/Behavioral: Negative    Objective:   Vitals:   05/25/16 0935  BP: (!) 147/99  Pulse: 72  Resp: 18  Temp: 97.8 F (36.6 C)    Physical Exam: Constitutional: Patient appears well-developed and well-nourished. No distress. HENT: Normocephalic, atraumatic, External right and left ear normal. Oropharynx is clear and moist.  Eyes: Conjunctivae and  EOM are normal. PERRLA, no scleral icterus. Neck: Normal ROM. Neck supple. No lymphadenopathy, No thyromegaly. CVS: RRR, S1/S2 +, no murmurs, no gallops, no rubs Pulmonary: Effort and breath sounds normal, no stridor, rhonchi, wheezes, rales.  Abdominal: Soft. Normoactive BS,, no distension, tenderness, rebound or guarding.  Musculoskeletal: Normal range of motion. No edema and no tenderness.  Neuro: Alert.Normal muscle tone coordination. Non-focal Skin: Skin is warm and dry. No rash noted. Not diaphoretic. No erythema. No pallor. Psychiatric: Normal mood and affect. Behavior, judgment, thought content normal.  Lab Results  Component Value Date   WBC 9.0 12/06/2015   HGB 17.9 12/06/2015   HCT  49.8 12/06/2015   MCV 91.2 12/06/2015   PLT 340 12/06/2015   Lab Results  Component Value Date   CREATININE 0.85 12/06/2015   BUN 16 12/06/2015   NA 131 (L) 12/06/2015   K 3.9 12/06/2015   CL 95 (L) 12/06/2015   CO2 24 12/06/2015    Lab Results  Component Value Date   HGBA1C 8.2 05/25/2016   Lipid Panel     Component Value Date/Time   CHOL 309 (H) 10/22/2014 1043   TRIG 116 10/22/2014 1043   HDL 64 10/22/2014 1043   CHOLHDL 4.8 10/22/2014 1043   VLDL 23 10/22/2014 1043   LDLCALC 222 (H) 10/22/2014 1043       Assessment and plan:   1. Chronic dental pain  - Ambulatory referral to Dentistry -Keflex 500 quid  2. Type 2 diabetes mellitus without complication, without long-term current use of insulin (HCC)  - POCT glycosylated hemoglobin (Hb A1C) - CBC with Differential - COMPLETE METABOLIC PANEL WITH GFR - Lipid panel - Microalbumin, urine - Ambulatory referral to Ophthalmology -INcrease metformin from 500 to 1000 mg bid -continue glipizide at current dosage.  3. Screening for HIV (human immunodeficiency virus)  - HIV antibody (with reflex)  4. Generalized anxiety disorder  - LORazepam (ATIVAN) 1 MG tablet; Take 1 tablet twice a day.  Dispense: 60 tablet; Refill: 0 - I have explained that this is a one-time prescription and he will need for arrange to get elsewhere.  5. Essential hypertension  - amLODipine (NORVASC) 5 MG tablet; Take 1 tablet (5 mg total) by mouth daily.  Dispense: 90 tablet; Refill: 1 - losartan-hydrochlorothiazide (HYZAAR) 100-12.5 MG tablet; Take 1 tablet by mouth daily.  Dispense: 90 tablet; Refill: 1    No Follow-up on file.  The patient was given clear instructions to go to ER or return to medical center if symptoms don't improve, worsen or new problems develop. The patient verbalized understanding.    Henrietta HooverLinda C Bernhardt FNP  05/25/2016, 3:33 PM

## 2016-05-26 LAB — MICROALBUMIN, URINE: MICROALB UR: 1 mg/dL

## 2016-05-26 LAB — HIV ANTIBODY (ROUTINE TESTING W REFLEX): HIV 1&2 Ab, 4th Generation: NONREACTIVE

## 2016-05-28 ENCOUNTER — Telehealth: Payer: Self-pay

## 2016-05-28 NOTE — Telephone Encounter (Signed)
Timothy Lowe he does not have any reason why he is not taking cholesterol medication and is willing to start some. Can you please sent into pharmacy.

## 2016-05-28 NOTE — Telephone Encounter (Signed)
Called, no answer. Left message for patient to call back when available. Thanks!  

## 2016-05-28 NOTE — Telephone Encounter (Signed)
-----   Message from Henrietta HooverLinda C Bernhardt, NP sent at 05/28/2016 10:17 AM EDT ----- HIV neg.AST 147, Alt 163. Cholesterol 241, Trig 209, LDL 148. Is there some reason that he is no on a cholesterol medication.CBC ok.

## 2016-06-19 ENCOUNTER — Other Ambulatory Visit: Payer: Self-pay | Admitting: Family Medicine

## 2016-06-19 DIAGNOSIS — F411 Generalized anxiety disorder: Secondary | ICD-10-CM

## 2016-08-09 ENCOUNTER — Other Ambulatory Visit: Payer: Self-pay | Admitting: Family Medicine

## 2016-08-09 ENCOUNTER — Telehealth: Payer: Self-pay

## 2016-08-09 DIAGNOSIS — K0889 Other specified disorders of teeth and supporting structures: Secondary | ICD-10-CM

## 2016-08-09 NOTE — Telephone Encounter (Signed)
Could you put this referral in workque and I will send paperwork to [facility]? Thank you

## 2016-09-03 ENCOUNTER — Ambulatory Visit: Payer: No Typology Code available for payment source | Admitting: Family Medicine

## 2016-09-18 ENCOUNTER — Ambulatory Visit: Payer: No Typology Code available for payment source | Admitting: Family Medicine

## 2016-10-16 ENCOUNTER — Ambulatory Visit: Payer: No Typology Code available for payment source | Admitting: Family Medicine

## 2017-07-06 DEATH — deceased
